# Patient Record
Sex: Male | Born: 1965 | Race: Black or African American | Hispanic: No | Marital: Single | Smoking: Never smoker
Health system: Southern US, Community
[De-identification: ages and names within clinical notes are randomized; demographics above are authoritative.]

## PROBLEM LIST (undated history)

## (undated) DIAGNOSIS — I10 Essential (primary) hypertension: Secondary | ICD-10-CM

## (undated) DIAGNOSIS — E119 Type 2 diabetes mellitus without complications: Secondary | ICD-10-CM

## (undated) DIAGNOSIS — M109 Gout, unspecified: Secondary | ICD-10-CM

## (undated) DIAGNOSIS — E785 Hyperlipidemia, unspecified: Secondary | ICD-10-CM

## (undated) HISTORY — DX: Essential (primary) hypertension: I10

## (undated) HISTORY — DX: Hyperlipidemia, unspecified: E78.5

---

## 1998-05-26 ENCOUNTER — Emergency Department (HOSPITAL_COMMUNITY): Admission: EM | Admit: 1998-05-26 | Discharge: 1998-05-26 | Payer: Self-pay | Admitting: Emergency Medicine

## 1998-05-26 ENCOUNTER — Encounter: Payer: Self-pay | Admitting: Emergency Medicine

## 1999-01-18 ENCOUNTER — Emergency Department (HOSPITAL_COMMUNITY): Admission: EM | Admit: 1999-01-18 | Discharge: 1999-01-18 | Payer: Self-pay

## 1999-09-08 ENCOUNTER — Emergency Department (HOSPITAL_COMMUNITY): Admission: EM | Admit: 1999-09-08 | Discharge: 1999-09-08 | Payer: Self-pay

## 2001-06-08 ENCOUNTER — Encounter: Payer: Self-pay | Admitting: Emergency Medicine

## 2001-06-08 ENCOUNTER — Emergency Department (HOSPITAL_COMMUNITY): Admission: EM | Admit: 2001-06-08 | Discharge: 2001-06-08 | Payer: Self-pay | Admitting: Emergency Medicine

## 2001-11-08 ENCOUNTER — Encounter: Payer: Self-pay | Admitting: *Deleted

## 2001-11-08 ENCOUNTER — Emergency Department (HOSPITAL_COMMUNITY): Admission: EM | Admit: 2001-11-08 | Discharge: 2001-11-08 | Payer: Self-pay | Admitting: *Deleted

## 2005-05-18 ENCOUNTER — Emergency Department (HOSPITAL_COMMUNITY): Admission: EM | Admit: 2005-05-18 | Discharge: 2005-05-18 | Payer: Self-pay | Admitting: Emergency Medicine

## 2005-11-22 ENCOUNTER — Emergency Department (HOSPITAL_COMMUNITY): Admission: EM | Admit: 2005-11-22 | Discharge: 2005-11-22 | Payer: Self-pay | Admitting: Family Medicine

## 2007-04-25 ENCOUNTER — Emergency Department (HOSPITAL_COMMUNITY): Admission: EM | Admit: 2007-04-25 | Discharge: 2007-04-25 | Payer: Self-pay | Admitting: Emergency Medicine

## 2009-05-21 ENCOUNTER — Emergency Department (HOSPITAL_COMMUNITY): Admission: EM | Admit: 2009-05-21 | Discharge: 2009-05-21 | Payer: Self-pay | Admitting: Family Medicine

## 2010-09-30 ENCOUNTER — Ambulatory Visit (INDEPENDENT_AMBULATORY_CARE_PROVIDER_SITE_OTHER): Payer: BC Managed Care – PPO

## 2010-09-30 ENCOUNTER — Inpatient Hospital Stay (INDEPENDENT_AMBULATORY_CARE_PROVIDER_SITE_OTHER)
Admission: RE | Admit: 2010-09-30 | Discharge: 2010-09-30 | Disposition: A | Discharge status: Home / Self Care | Payer: BC Managed Care – PPO | Source: Ambulatory Visit | Attending: Family Medicine | Admitting: Family Medicine

## 2010-09-30 DIAGNOSIS — K219 Gastro-esophageal reflux disease without esophagitis: Secondary | ICD-10-CM

## 2012-02-22 ENCOUNTER — Emergency Department (HOSPITAL_COMMUNITY): Payer: Self-pay

## 2012-02-22 ENCOUNTER — Encounter (HOSPITAL_COMMUNITY): Payer: Self-pay | Admitting: Emergency Medicine

## 2012-02-22 ENCOUNTER — Emergency Department (HOSPITAL_COMMUNITY)
Admission: EM | Admit: 2012-02-22 | Discharge: 2012-02-22 | Disposition: A | Discharge status: Home / Self Care | Payer: Self-pay | Attending: Emergency Medicine | Admitting: Emergency Medicine

## 2012-02-22 DIAGNOSIS — Z79899 Other long term (current) drug therapy: Secondary | ICD-10-CM | POA: Insufficient documentation

## 2012-02-22 DIAGNOSIS — R109 Unspecified abdominal pain: Secondary | ICD-10-CM | POA: Insufficient documentation

## 2012-02-22 DIAGNOSIS — M109 Gout, unspecified: Secondary | ICD-10-CM | POA: Insufficient documentation

## 2012-02-22 HISTORY — DX: Gout, unspecified: M10.9

## 2012-02-22 LAB — CBC WITH DIFFERENTIAL/PLATELET
Basophils Relative: 0 % (ref 0–1)
Eosinophils Absolute: 0.1 10*3/uL (ref 0.0–0.7)
Eosinophils Relative: 2 % (ref 0–5)
Lymphs Abs: 1.5 10*3/uL (ref 0.7–4.0)
MCH: 26.8 pg (ref 26.0–34.0)
MCHC: 32.9 g/dL (ref 30.0–36.0)
MCV: 81.4 fL (ref 78.0–100.0)
Neutrophils Relative %: 72 % (ref 43–77)
Platelets: 189 10*3/uL (ref 150–400)
RBC: 5.11 MIL/uL (ref 4.22–5.81)

## 2012-02-22 LAB — COMPREHENSIVE METABOLIC PANEL
Albumin: 3.7 g/dL (ref 3.5–5.2)
BUN: 14 mg/dL (ref 6–23)
Calcium: 8.9 mg/dL (ref 8.4–10.5)
GFR calc Af Amer: 75 mL/min — ABNORMAL LOW (ref >90)
Glucose, Bld: 139 mg/dL — ABNORMAL HIGH (ref 70–99)
Sodium: 139 mEq/L (ref 135–145)
Total Protein: 7.1 g/dL (ref 6.0–8.3)

## 2012-02-22 LAB — POCT I-STAT TROPONIN I: Troponin i, poc: 0 ng/mL (ref 0.00–0.08)

## 2012-02-22 MED ORDER — PANTOPRAZOLE SODIUM 40 MG PO TBEC
40.0000 mg | DELAYED_RELEASE_TABLET | Freq: Once | ORAL | Status: AC
  Administered 2012-02-22: 40 mg via ORAL
  Filled 2012-02-22: qty 1

## 2012-02-22 NOTE — ED Provider Notes (Signed)
History     CSN: [id number]  Arrival date & time 02/22/12  0018   None     Chief Complaint  Patient presents with  . Chest Pain    (Consider location/radiation/quality/duration/timing/severity/associated sxs/prior treatment) HPI [name] with abdominal pain described as cramping that began yesterday evening. The symptoms were moderate to severe at their worst. He attributes this to eating a hot dog yesterday evening. His abdominal cramping has significantly improved since. It is not worse with palpation. He has had only slight nausea with no vomiting or diarrhea. He has had an occasional dry cough. While in the ED he is developed a sore throat, worse with swallowing, and a mild headache.  Past Medical History  Diagnosis Date  . Gout     History reviewed. No pertinent past surgical history.  No family history on file.  History  Substance Use Topics  . Smoking status: Never Smoker   . Smokeless tobacco: Not on file  . Alcohol Use: No      Review of Systems  All other systems reviewed and are negative.    Allergies  Review of patient's allergies indicates no known allergies.  Home Medications   Current Outpatient Rx  Name  Route  Sig  Dispense  Refill  . FEBUXOSTAT 80 MG PO TABS   Oral   Take 1 tablet by mouth daily.         Marland Kitchen HYDROCODONE-ACETAMINOPHEN 10-500 MG PO TABS   Oral   Take 1 tablet by mouth every 6 (six) hours as needed. Pain         . IBUPROFEN 800 MG PO TABS   Oral   Take 800 mg by mouth every 8 (eight) hours as needed. Pain         . PREDNISONE 20 MG PO TABS   Oral   Take 60 mg by mouth daily. Take for 5 days during a gout flare up           BP 121/82  Pulse 100  Temp 98.5 F (36.9 C) (Oral)  Resp 20  SpO2 98%  Physical Exam General: Well-developed, well-nourished male in no acute distress; appearance consistent with age of record HENT: normocephalic, atraumatic; no pharyngeal erythema, edema or  exudate Eyes: Pupils obscured by cosmetic contact lenses; extraocular muscles intact Neck: supple; no lymphadenopathy Heart: regular rate and rhythm Lungs: clear to auscultation bilaterally Abdomen: soft; nondistended; nontender; no masses or hepatosplenomegaly; bowel sounds present Extremities: No deformity; full range of motion Neurologic: Awake, alert and oriented; motor function intact in all extremities and symmetric; no facial droop Skin: Warm and dry Psychiatric: Normal mood and affect    ED Course  Procedures (including critical care time)     MDM   Nursing notes and vitals signs, including pulse oximetry, reviewed.  Summary of this visit's results, reviewed by myself:  Labs:  Results for orders placed during the hospital encounter of 02/22/12 (from the past 24 hour(s))  CBC WITH DIFFERENTIAL     Status: Normal   Collection Time   02/22/12 12:26 AM      Component Value Range   WBC 8.7  4.0 - 10.5 K/uL   RBC 5.11  4.22 - 5.81 MIL/uL   Hemoglobin 13.7  13.0 - 17.0 g/dL   HCT 78.2  95.6 - 21.3 %   MCV 81.4  78.0 - 100.0 fL   MCH 26.8  26.0 - 34.0 pg   MCHC 32.9  30.0 - 36.0  g/dL   RDW 11.9  14.7 - 82.9 %   Platelets 189  150 - 400 K/uL   Neutrophils Relative 72  43 - 77 %   Neutro Abs 6.3  1.7 - 7.7 K/uL   Lymphocytes Relative 17  12 - 46 %   Lymphs Abs 1.5  0.7 - 4.0 K/uL   Monocytes Relative 9  3 - 12 %   Monocytes Absolute 0.8  0.1 - 1.0 K/uL   Eosinophils Relative 2  0 - 5 %   Eosinophils Absolute 0.1  0.0 - 0.7 K/uL   Basophils Relative 0  0 - 1 %   Basophils Absolute 0.0  0.0 - 0.1 K/uL  COMPREHENSIVE METABOLIC PANEL     Status: Abnormal   Collection Time   02/22/12 12:26 AM      Component Value Range   Sodium 139  135 - 145 mEq/L   Potassium 4.2  3.5 - 5.1 mEq/L   Chloride 102  96 - 112 mEq/L   CO2 26  19 - 32 mEq/L   Glucose, Bld 139 (*) 70 - 99 mg/dL   BUN 14  6 - 23 mg/dL   Creatinine, Ser 5.62  0.50 - 1.35 mg/dL   Calcium 8.9  8.4 - 13.0  mg/dL   Total Protein 7.1  6.0 - 8.3 g/dL   Albumin 3.7  3.5 - 5.2 g/dL   AST 22  0 - 37 U/L   ALT 18  0 - 53 U/L   Alkaline Phosphatase 64  39 - 117 U/L   Total Bilirubin 0.4  0.3 - 1.2 mg/dL   GFR calc non Af Amer 65 (*) >90 mL/min   GFR calc Af Amer 75 (*) >90 mL/min  POCT I-STAT TROPONIN I     Status: Normal   Collection Time   02/22/12 12:39 AM      Component Value Range   Troponin i, poc 0.00  0.00 - 0.08 ng/mL   Comment 3             Imaging Studies: Dg Chest 2 View  02/22/2012  *RADIOLOGY REPORT*  Clinical Data: Mid chest pain.  CHEST - 2 VIEW  Comparison: Chest radiograph performed 09/30/2010  Findings: The lungs are well-aerated.  Minimal bibasilar opacities likely reflect atelectasis.  There is no evidence of pleural effusion or pneumothorax.  The heart is normal in size; the mediastinal contour is within normal limits.  No acute osseous abnormalities are seen.  IMPRESSION: Minimal bibasilar airspace opacity likely reflect atelectasis; lungs otherwise clear.   Original Report Authenticated By: Tonia Ghent, M.D.    EKG Interpretation:  Date & Time: 02/22/2012 12:28 AM  Rate: 103  Rhythm: sinus tachycardia  QRS Axis: normal  Intervals: normal  ST/T Wave abnormalities: normal  Conduction Disutrbances:none  Narrative Interpretation:   Old EKG Reviewed: none available  4:42 AM The patient's symptomatology is most consistent with a viral illness. This would explain his abdominal cramping, sore throat and cough. He was advised to take over-the-counter medications for this.              Hanley Seamen, MD 02/22/12 [phone]

## 2012-02-22 NOTE — ED Notes (Signed)
PT. REPORTS CHEST PAIN /UPPER ABDOMINAL PAIN WITH SOB , OCCASIONAL DRY COUGH ONSET THIS EVENING , DENIES NAUSEA /VOMITTING OR DIAPHORESIS.
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
## 2012-09-27 ENCOUNTER — Emergency Department (HOSPITAL_COMMUNITY): Payer: Self-pay

## 2012-09-27 ENCOUNTER — Emergency Department (HOSPITAL_COMMUNITY)
Admission: EM | Admit: 2012-09-27 | Discharge: 2012-09-27 | Disposition: A | Discharge status: Home / Self Care | Payer: Self-pay | Attending: Emergency Medicine | Admitting: Emergency Medicine

## 2012-09-27 ENCOUNTER — Encounter (HOSPITAL_COMMUNITY): Payer: Self-pay | Admitting: *Deleted

## 2012-09-27 DIAGNOSIS — Z79899 Other long term (current) drug therapy: Secondary | ICD-10-CM | POA: Insufficient documentation

## 2012-09-27 DIAGNOSIS — R109 Unspecified abdominal pain: Secondary | ICD-10-CM | POA: Insufficient documentation

## 2012-09-27 DIAGNOSIS — N509 Disorder of male genital organs, unspecified: Secondary | ICD-10-CM | POA: Insufficient documentation

## 2012-09-27 DIAGNOSIS — IMO0002 Reserved for concepts with insufficient information to code with codable children: Secondary | ICD-10-CM | POA: Insufficient documentation

## 2012-09-27 DIAGNOSIS — R300 Dysuria: Secondary | ICD-10-CM | POA: Insufficient documentation

## 2012-09-27 DIAGNOSIS — M109 Gout, unspecified: Secondary | ICD-10-CM | POA: Insufficient documentation

## 2012-09-27 LAB — URINALYSIS, ROUTINE W REFLEX MICROSCOPIC
Leukocytes, UA: NEGATIVE
Nitrite: NEGATIVE
Protein, ur: NEGATIVE mg/dL
Specific Gravity, Urine: 1.023 (ref 1.005–1.030)
Urobilinogen, UA: 1 mg/dL (ref 0.0–1.0)

## 2012-09-27 MED ORDER — HYDROCODONE-ACETAMINOPHEN 5-325 MG PO TABS: 2.0000 | ORAL_TABLET | ORAL | Status: DC | PRN

## 2012-09-27 MED ORDER — KETOROLAC TROMETHAMINE 60 MG/2ML IM SOLN
60.0000 mg | Freq: Once | INTRAMUSCULAR | Status: AC
  Administered 2012-09-27: 60 mg via INTRAMUSCULAR
  Filled 2012-09-27: qty 2

## 2012-09-27 NOTE — ED Notes (Signed)
Pt states 3 days ago developed R flank pain and R testicle pain, states this morning noticed a little bit of burning w/ urination, states recently took prednisone, denies hx of kidney stones, states when he urinates it relieves the pressure, denies urinary freq.

## 2012-09-27 NOTE — ED Provider Notes (Signed)
CSN: [id number]     Arrival date & time 09/27/12  1134 History     First MD Initiated Contact with Patient 09/27/12 1154     Chief Complaint  Patient presents with  . Flank Pain    right  . Testicle Pain    right   (Consider location/radiation/quality/duration/timing/severity/associated sxs/prior Treatment) HPI Comments: Patient presents with a three day history of right flank and lower quadrant pain that radiates into his right lower abdomen and testicle.  He denies any injury or trauma.  No n/v/d.  He does report some burning with urination.  No fevers or chills.    Patient is a 47 y.o. male presenting with flank pain. The history is provided by the patient.  Flank Pain This is a new problem. Episode onset: 3 days ago. The problem occurs constantly. The problem has been gradually worsening. Nothing aggravates the symptoms. Nothing relieves the symptoms. He has tried nothing for the symptoms. The treatment provided no relief.    Past Medical History  Diagnosis Date  . Gout    History reviewed. No pertinent past surgical history. No family history on file. History  Substance Use Topics  . Smoking status: Never Smoker   . Smokeless tobacco: Never Used  . Alcohol Use: No    Review of Systems  Genitourinary: Positive for flank pain.  All other systems reviewed and are negative.    Allergies  Aspirin  Home Medications   Current Outpatient Rx  Name  Route  Sig  Dispense  Refill  . Febuxostat 80 MG TABS   Oral   Take 1 tablet by mouth daily.         Marland Kitchen HYDROcodone-acetaminophen (LORTAB) 10-500 MG per tablet   Oral   Take 1 tablet by mouth every 6 (six) hours as needed. Pain         . ibuprofen (ADVIL,MOTRIN) 800 MG tablet   Oral   Take 800 mg by mouth every 8 (eight) hours as needed. Pain         . predniSONE (DELTASONE) 20 MG tablet   Oral   Take 60 mg by mouth daily. Take for 5 days during a gout flare up          BP 135/102  Pulse 70  Temp(Src) 99  F (37.2 C) (Oral)  Resp 20  SpO2 98% Physical Exam  Nursing note and vitals reviewed. Constitutional: He is oriented to person, place, and time. He appears well-developed and well-nourished. No distress.  HENT:  Head: Normocephalic and atraumatic.  Mouth/Throat: Oropharynx is clear and moist.  Neck: Normal range of motion. Neck supple.  Cardiovascular: Normal rate, regular rhythm and normal heart sounds.   No murmur heard. Pulmonary/Chest: Effort normal and breath sounds normal. No respiratory distress. He has no wheezes.  Abdominal: Soft. Bowel sounds are normal. He exhibits no distension. There is tenderness.  There is mild ttp in the right lower quadrant with no rebound or guarding.    Genitourinary: Penis normal. No penile tenderness.  The penis and testicles appear grossly normal.  Both testicles are freely mobile without evidence for torsion.  There is no inguinal defect or hernia.    Musculoskeletal: Normal range of motion. He exhibits no edema.  Neurological: He is alert and oriented to person, place, and time.  Skin: Skin is warm and dry. He is not diaphoretic.    ED Course   Procedures (including critical care time)  Labs Reviewed  URINALYSIS, ROUTINE W REFLEX MICROSCOPIC  No results found. No diagnosis found.  MDM  Ct and ua are unremarkable.  I suspect the symptoms are musculoskeletal.  Will discharge to home with pain meds, prn follow up.  Geoffery Lyons, MD 09/27/12 1451

## 2012-10-01 ENCOUNTER — Ambulatory Visit: Payer: Self-pay | Admitting: Internal Medicine

## 2012-10-01 VITALS — BP 120/84 | HR 78 | Temp 98.0°F | Resp 18 | Ht 69.0 in | Wt 192.4 lb

## 2012-10-01 DIAGNOSIS — Z0289 Encounter for other administrative examinations: Secondary | ICD-10-CM

## 2012-10-02 ENCOUNTER — Encounter: Payer: Self-pay | Admitting: Internal Medicine

## 2012-10-02 NOTE — Progress Notes (Signed)
  Subjective:    Patient ID: Ricky Glass, male    DOB: September 29, 1965, 47 y.o.   MRN: [id number]  HPI No problems   Review of Systems neg    Objective:   Physical Exam Normal exam for DOT       Assessment & Plan:  DOT card

## 2012-10-09 ENCOUNTER — Encounter: Payer: Self-pay | Admitting: Internal Medicine

## 2013-05-25 ENCOUNTER — Emergency Department (HOSPITAL_COMMUNITY)
Admission: EM | Admit: 2013-05-25 | Discharge: 2013-05-26 | Disposition: A | Discharge status: Home / Self Care | Payer: BC Managed Care – PPO | Attending: Emergency Medicine | Admitting: Emergency Medicine

## 2013-05-25 DIAGNOSIS — M5380 Other specified dorsopathies, site unspecified: Secondary | ICD-10-CM | POA: Insufficient documentation

## 2013-05-25 DIAGNOSIS — R109 Unspecified abdominal pain: Secondary | ICD-10-CM | POA: Insufficient documentation

## 2013-05-25 DIAGNOSIS — Z79899 Other long term (current) drug therapy: Secondary | ICD-10-CM | POA: Insufficient documentation

## 2013-05-25 DIAGNOSIS — M62830 Muscle spasm of back: Secondary | ICD-10-CM

## 2013-05-25 DIAGNOSIS — IMO0001 Reserved for inherently not codable concepts without codable children: Secondary | ICD-10-CM | POA: Insufficient documentation

## 2013-05-25 DIAGNOSIS — M109 Gout, unspecified: Secondary | ICD-10-CM | POA: Insufficient documentation

## 2013-05-26 ENCOUNTER — Encounter (HOSPITAL_COMMUNITY): Payer: Self-pay | Admitting: Emergency Medicine

## 2013-05-26 MED ORDER — METHOCARBAMOL 100 MG/ML IJ SOLN
1000.0000 mg | Freq: Once | INTRAMUSCULAR | Status: DC
  Filled 2013-05-26: qty 10

## 2013-05-26 MED ORDER — HYDROCODONE-ACETAMINOPHEN 5-325 MG PO TABS: 2.0000 | ORAL_TABLET | ORAL | Status: DC | PRN

## 2013-05-26 MED ORDER — METHOCARBAMOL 500 MG PO TABS
1000.0000 mg | ORAL_TABLET | Freq: Once | ORAL | Status: AC
  Administered 2013-05-26: 1000 mg via ORAL
  Filled 2013-05-26: qty 2

## 2013-05-26 MED ORDER — NAPROXEN 500 MG PO TABS: 500.0000 mg | ORAL_TABLET | Freq: Two times a day (BID) | ORAL | Status: DC

## 2013-05-26 MED ORDER — KETOROLAC TROMETHAMINE 60 MG/2ML IM SOLN
60.0000 mg | Freq: Once | INTRAMUSCULAR | Status: AC
  Administered 2013-05-26: 60 mg via INTRAMUSCULAR
  Filled 2013-05-26: qty 2

## 2013-05-26 MED ORDER — METHOCARBAMOL 500 MG PO TABS: 500.0000 mg | ORAL_TABLET | Freq: Two times a day (BID) | ORAL | Status: DC | PRN

## 2013-05-26 NOTE — Discharge Instructions (Signed)
Back Pain: ° ° °Your back pain should be treated with medicines such as ibuprofen or aleve and this back pain should get better over the next 2 weeks.  However if you develop severe or worsening pain, low back pain with fever, numbness, weakness or inability to walk or urinate, you should return to the ER immediately.  Please follow up with your doctor this week for a recheck if still having symptoms. °Low back pain is discomfort in the lower back that may be due to injuries to muscles and ligaments around the spine.  Occasionally, it may be caused by a a problem to a part of the spine called a disc.  The pain may last several days or a week;  However, most patients get completely well in 4 weeks. ° °Self - care:  The application of heat can help soothe the pain.  Maintaining your daily activities, including walking, is encourged, as it will help you get better faster than just staying in bed. ° °Medications are also useful to help with pain control.  A commonly prescribed medications includes acetaminophen.  This medication is generally safe, though you should not take more than 8 of the extra strength (500mg) pills a day. ° °Non steroidal anti inflammatory medications including Ibuprofen and naproxen;  These medications help both pain and swelling and are very useful in treating back pain.  They should be taken with food, as they can cause stomach upset, and more seriously, stomach bleeding.   ° °Muscle relaxants:  These medications can help with muscle tightness that is a cause of lower back pain.  Most of these medications can cause drowsiness, and it is not safe to drive or use dangerous machinery while taking them. ° °You will need to follow up with  Your primary healthcare provider in 1-2 weeks for reassessment. ° °Be aware that if you develop new symptoms, such as a fever, leg weakness, difficulty with or loss of control of your urine or bowels, abdominal pain, or more severe pain, you will need to seek  medical attention and  / or return to the Emergency department. ° °If you do not have a doctor see the list below. ° °RESOURCE GUIDE ° °Chronic Pain Problems: °Contact [facility]  [phone] °Patients need to be referred by their primary care doctor. ° °Insufficient Money for Medicine: °Contact [employer]:  call "211" or [facility] 271-5999. ° °No Primary Care Doctor: °- Call [facility]  832-8000 - can help you locate a primary care doctor that  accepts your insurance, provides certain services, etc. °- Physician Referral Service- 1-800-533-3463 ° °Agencies that provide inexpensive medical care: °- [facility]  832-8035 °-  Internal Medicine  832-7272 °- [facility]  [phone] °- Women[facility] Clinic  832-4777 °- [facility]  [phone] °- [facility]  [phone] ° °Medicaid-accepting [place] Providers: °- [facility]- [address] ° [phone], Mon-Fri 9am-7pm, Sat 9am-1pm °- [facility]- [address], [address] ° [phone] °- [facility]- [address], [address] ° [phone] °- [facility]- [address] [place] [address] ° [phone] °- [facility]- [address], [address], [phone] ° Only accepts [place] Access Medicaid patients after they have their name  applied to their card ° °Self Pay (no insurance) in [place]: °- Sickle Cell Patients: Dr Eric Dean, [facility] °   [address], [phone] °- [facility]- [address] ° [phone] °      -     [facility]- [address] °      -     [facility]- see information above (Speak to Pam H if you do not have insurance) °      -  [facility]- [address], [phone] °      -  [facility] [place]- [address],  [phone] °      -  [facility]- [address] [place] [address],  [phone] °      -  Dr Osei-Bonsu-  [address], [place], [phone] °      -  [facility]- [address], [phone] °      -  [facility]- [address] [place] [address], [phone], also [address], [phone] °      -    [facility]- [address], [phone], 1st & 3rd Saturday   every month, 10am-1pm ° °1) Find a Doctor and Pay Out of Pocket °Although you won't have to find out who is covered by your insurance plan, it is a good idea to ask around and get recommendations. You will then need to call the office and see if the doctor you have chosen will accept you as a new patient and what types of options they offer for patients who are self-pay. Some doctors offer discounts or will set up payment plans for their patients who do not have insurance, but you will need to ask so you aren't surprised when you get to your appointment. ° °2) Contact Your Local Health Department °Not all health departments have doctors that can see patients for sick visits, but many do, so it is worth a call to see if yours does. If you don't know where your local health department is, you can check in your phone book. The CDC also has a tool to help you locate your state's health department, and many state websites also have listings of all of their local health departments. ° °3) Find a Walk-in Clinic °If your illness is not likely to be very severe or complicated, you may want to try a walk in clinic. These are popping up all over the country in pharmacies, drugstores, and shopping centers. They're usually staffed by nurse practitioners or physician assistants that have been trained to treat common illnesses and complaints. They're usually fairly quick and inexpensive. However, if you have serious medical issues or chronic medical problems, these are probably not your best option ° °STD Testing °- [place] [place], [facility], [address], [place], phone [phone] or 1-877-539-9860.  Monday - Friday, call for an appointment. °- [place] [place], [facility], [address], [place], phone [phone] or 1-877-539-9860.  Monday - Friday, call for an appointment. ° °Abuse/Neglect: °- [place] Child Abuse Hotline (336) 641-3795 °- [place] Child Abuse Hotline 800-378-5315 (After Hours) ° °Emergency Shelter:  [facility] (336) 271-5985 ° °Maternity Homes: °- [facility] (336) 275-9566 °- [facility] (704) 372-4663 ° °MRSA Hotline #:   832-7006 ° °[place] Resources ° °[facility] [place]  [employer] [place] [facility] °[address]                 [address]         [address]  °[place]                                               [place]                              [place] °Phone:  349-3220                                  Phone:  342-7768                   Phone:  342-8140 ° °[facility], 342-8316 °- [place] [facility] - [facility]- 1-888-581-9988 °      -     [facility] in [place], [address],                                  336-349-4454, Insurance ° °[place] Child Abuse Hotline °(336) 342-1394 or (336) 342-3537 (After Hours) ° ° °Behavioral Health Services ° °Substance Abuse Resources: °- [facility]  336-882-2125 °- [facility] 336-784-9470 °- [facility] 336-285-9073 °- [facility] 336-845-3988 °- Residential & Outpatient Substance Abuse Program  800-659-3381 ° °Psychological Services: °- [facility]  832-9600 °- [facility]  378-7881 °- [facility], [address], [place], ACCESS LINE: 1-800-853-5163 or 336-641-4981, Http://www.[facility]/services/adult.htm ° °Dental Assistance ° °If unable to pay or  uninsured, contact:  [facility] or [facility] to become qualified for the adult dental clinic. ° °Patients with Medicaid: [facility] °[address], [phone] °[address], [phone] ° °If unable to pay, or uninsured, contact [facility] (271-5999) or [place] (641-3152 in [place], 842-7733 in [place]) to become qualified for the adult dental clinic ° °Other Low-Cost Community Dental Services: °- [facility]- [address], [place], [place], [address], 723-1848, Ext. [address], 2nd and 4th Thursday of the month at 6:30am.  10 clients each day by appointment, can sometimes see walk-in patients if someone does not show for an appointment. °- [facility]- [address], [place], [place], [address], 723-7904 °- [facility]- [address], [place], [place], [address], [phone] °- [facility]- 342-8273 °- [facility]- 703-3100 °- [facility]- 570-6415 ° ° ° ° ° ° °
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
## 2013-09-15 ENCOUNTER — Telehealth: Payer: Self-pay
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
## 2013-09-15 NOTE — Telephone Encounter (Signed)
Spoke to [place]- the DOT card is in Dr. Perrin MalteseGuest box at the bottom of the stairs. I will follow up on this tomorrow when Dr. Perrin MalteseGuest is in the office.

## 2013-09-16 NOTE — Telephone Encounter (Signed)
Called patient and notified DOT card ready for pick up

## 2014-06-27 ENCOUNTER — Ambulatory Visit (INDEPENDENT_AMBULATORY_CARE_PROVIDER_SITE_OTHER): Payer: 59 | Admitting: Emergency Medicine

## 2014-06-27 ENCOUNTER — Ambulatory Visit (INDEPENDENT_AMBULATORY_CARE_PROVIDER_SITE_OTHER): Payer: 59

## 2014-06-27 VITALS — BP 128/88 | HR 92 | Temp 98.0°F | Resp 16 | Ht 69.5 in | Wt 211.2 lb

## 2014-06-27 DIAGNOSIS — R1013 Epigastric pain: Secondary | ICD-10-CM | POA: Diagnosis not present

## 2014-06-27 DIAGNOSIS — M25521 Pain in right elbow: Secondary | ICD-10-CM

## 2014-06-27 DIAGNOSIS — M109 Gout, unspecified: Secondary | ICD-10-CM

## 2014-06-27 DIAGNOSIS — M10021 Idiopathic gout, right elbow: Secondary | ICD-10-CM

## 2014-06-27 LAB — POCT URINALYSIS DIPSTICK
Bilirubin, UA: NEGATIVE
Glucose, UA: NEGATIVE
Ketones, UA: NEGATIVE
LEUKOCYTES UA: NEGATIVE
Nitrite, UA: NEGATIVE
PROTEIN UA: NEGATIVE
RBC UA: NEGATIVE
Spec Grav, UA: 1.02
UROBILINOGEN UA: 1
pH, UA: 5.5

## 2014-06-27 LAB — POCT CBC
GRANULOCYTE PERCENT: 72.2 % (ref 37–80)
HEMATOCRIT: 42.7 % — AB (ref 43.5–53.7)
HEMOGLOBIN: 13.3 g/dL — AB (ref 14.1–18.1)
Lymph, poc: 2.1 (ref 0.6–3.4)
MCH, POC: 25.8 pg — AB (ref 27–31.2)
MCHC: 31.2 g/dL — AB (ref 31.8–35.4)
MCV: 82.6 fL (ref 80–97)
MID (cbc): 0.6 (ref 0–0.9)
MPV: 7.7 fL (ref 0–99.8)
POC GRANULOCYTE: 7.2 — AB (ref 2–6.9)
POC LYMPH PERCENT: 21.3 %L (ref 10–50)
POC MID %: 6.5 % (ref 0–12)
Platelet Count, POC: 217 10*3/uL (ref 142–424)
RBC: 5.17 M/uL (ref 4.69–6.13)
RDW, POC: 14.2 %
WBC: 10 10*3/uL (ref 4.6–10.2)

## 2014-06-27 LAB — POCT UA - MICROSCOPIC ONLY
BACTERIA, U MICROSCOPIC: NEGATIVE
CASTS, UR, LPF, POC: NEGATIVE
CRYSTALS, UR, HPF, POC: NEGATIVE
MUCUS UA: NEGATIVE
RBC, URINE, MICROSCOPIC: NEGATIVE
Yeast, UA: NEGATIVE

## 2014-06-27 LAB — POCT SEDIMENTATION RATE: POCT SED RATE: 56 mm/hr — AB (ref 0–22)

## 2014-06-27 MED ORDER — INDOMETHACIN 50 MG PO CAPS: 50.0000 mg | ORAL_CAPSULE | Freq: Three times a day (TID) | ORAL | Status: DC

## 2014-06-27 MED ORDER — COLCHICINE 0.6 MG PO TABS: ORAL_TABLET | ORAL | Status: DC

## 2014-06-27 NOTE — Patient Instructions (Signed)

## 2014-06-27 NOTE — Progress Notes (Signed)
[facility] [address], [place] [address] [phone]  Date:  06/27/2014   Name:  Ricky Glass   DOB:  Apr 23, 1965   MRN:  [id number]  PCP:  Pcp Not In System    Chief Complaint: Arm Pain; Arm Swelling; Headache; and Abdominal Pain   History of Present Illness:  Montreal R Glass is a 49 y.o. very pleasant male patient who presents with the following:  Has no history of injury to right elbow.  Says awoke yesterday with pain and immobility of elbow and swelling.   Tender over olecranon bursa Has gout and takes allopurinol.  Has prescription for motrin and vicodin Pain in elbow associated with swelling. No injury or overuse. Has noted crampy abdominal pain following trip out of town Has dark urine associated with myalgias and arthralgias and nausea Poor appetite. No improvement with over the counter medications or other home remedies.  Denies other complaint or health concern today.  There are no active problems to display for this patient.   Past Medical History  Diagnosis Date  . Gout     History reviewed. No pertinent past surgical history.  History  Substance Use Topics  . Smoking status: Never Smoker   . Smokeless tobacco: Never Used  . Alcohol Use: No    History reviewed. No pertinent family history.  Allergies  Allergen Reactions  . Aspirin     Childhood reaction    Medication list has been reviewed and updated.  Current Outpatient Prescriptions on File Prior to Visit  Medication Sig Dispense Refill  . allopurinol (ZYLOPRIM) 100 MG tablet Take 100 mg by mouth daily.    Marland Kitchen. HYDROcodone-acetaminophen (NORCO) 10-325 MG per tablet Take 1 tablet by mouth every 6 (six) hours as needed for moderate pain.    Marland Kitchen. HYDROcodone-acetaminophen (NORCO/VICODIN) 5-325 MG per tablet Take 2 tablets by mouth every 4 (four) hours as needed. 10 tablet 0  . naproxen (NAPROSYN) 500 MG tablet Take 1 tablet (500 mg total) by mouth 2 (two) times daily with  a meal. 30 tablet 0  . methocarbamol (ROBAXIN) 500 MG tablet Take 1 tablet (500 mg total) by mouth 2 (two) times daily as needed for muscle spasms. (Patient not taking: Reported on 06/27/2014) 20 tablet 0   No current facility-administered medications on file prior to visit.    Review of Systems:  Review of Systems  Constitutional: Negative for fever, chills and fatigue.  HENT: Negative for congestion, ear pain, hearing loss, postnasal drip, rhinorrhea and sinus pressure.   Eyes: Negative for discharge and redness.  Respiratory: Negative for cough, shortness of breath and wheezing.   Cardiovascular: Negative for chest pain and leg swelling.  Gastrointestinal: Negative for nausea, vomiting, abdominal pain, constipation and blood in stool.  Genitourinary: Negative for dysuria, urgency and frequency.  Musculoskeletal: Negative for neck stiffness.  Skin: Negative for rash.  Neurological: Negative for seizures, weakness and headaches.     Physical Examination: Filed Vitals:   06/27/14 1719  BP: 128/88  Pulse: 92  Temp: 98 F (36.7 C)  Resp: 16   Filed Vitals:   06/27/14 1719  Height: 5' 9.5" (1.765 m)  Weight: 211 lb 3.2 oz (95.8 kg)   Body mass index is 30.75 kg/(m^2). Ideal Body Weight: Weight in (lb) to have BMI = 25: 171.4  GEN: WDWN, NAD, Non-toxic, A & O x 3 HEENT: Atraumatic, Normocephalic. Neck supple. No masses, No LAD. Ears and Nose: No external deformity. CV: RRR,  No M/G/R. No JVD. No thrill. No extra heart sounds. PULM: CTA B, no wheezes, crackles, rhonchi. No retractions. No resp. distress. No accessory muscle use. ABD: S, NT, ND, +BS. No rebound. No HSM. EXTR: No c/c/e  Right elbow swelling with limited ROM and tenderness medial epicondyle NEURO Normal gait.  PSYCH: Normally interactive. Conversant. Not depressed or anxious appearing.  Calm demeanor.    Assessment and Plan: Gout  Possible hepatitis Labs pending Indocin  Colchicine   Signed Phillips Odor, MD   [facility] reading (PRIMARY) by  Dr. Lemar Lofty arthritis??  No acute injury.  Results for orders placed or performed in visit on 06/27/14  POCT CBC  Result Value Ref Range   WBC 10.0 4.6 - 10.2 K/uL   Lymph, poc 2.1 0.6 - 3.4   POC LYMPH PERCENT 21.3 10 - 50 %L   MID (cbc) 0.6 0 - 0.9   POC MID % 6.5 0 - 12 %M   POC Granulocyte 7.2 (A) 2 - 6.9   Granulocyte percent 72.2 37 - 80 %G   RBC 5.17 4.69 - 6.13 M/uL   Hemoglobin 13.3 (A) 14.1 - 18.1 g/dL   HCT, POC 16.1 (A) 09.6 - 53.7 %   MCV 82.6 80 - 97 fL   MCH, POC 25.8 (A) 27 - 31.2 pg   MCHC 31.2 (A) 31.8 - 35.4 g/dL   RDW, POC 04.5 %   Platelet Count, POC 217 142 - 424 K/uL   MPV 7.7 0 - 99.8 fL  POCT urinalysis dipstick  Result Value Ref Range   Color, UA yellow    Clarity, UA clear    Glucose, UA neg    Bilirubin, UA neg    Ketones, UA neg    Spec Grav, UA 1.020    Blood, UA neg    pH, UA 5.5    Protein, UA neg    Urobilinogen, UA 1.0    Nitrite, UA neg    Leukocytes, UA Negative   POCT UA - Microscopic Only  Result Value Ref Range   WBC, Ur, HPF, POC 0-3    RBC, urine, microscopic neg    Bacteria, U Microscopic neg    Mucus, UA neg    Epithelial cells, urine per micros 0-1    Crystals, Ur, HPF, POC neg    Casts, Ur, LPF, POC neg    Yeast, UA neg

## 2014-06-28 LAB — URIC ACID: Uric Acid, Serum: 8.9 mg/dL — ABNORMAL HIGH (ref 4.0–7.8)

## 2014-06-28 LAB — HEPATITIS PANEL, ACUTE
HCV Ab: NEGATIVE
HEP A IGM: NONREACTIVE
HEP B S AG: NEGATIVE
Hep B C IgM: NONREACTIVE

## 2014-06-28 LAB — HEPATITIS C ANTIBODY: HCV Ab: NEGATIVE

## 2014-09-04 ENCOUNTER — Ambulatory Visit (INDEPENDENT_AMBULATORY_CARE_PROVIDER_SITE_OTHER): Payer: 59 | Admitting: Family Medicine

## 2014-09-04 VITALS — BP 114/82 | HR 103 | Temp 97.9°F | Resp 14 | Ht 69.5 in | Wt 209.4 lb

## 2014-09-04 DIAGNOSIS — M1000 Idiopathic gout, unspecified site: Secondary | ICD-10-CM | POA: Diagnosis not present

## 2014-09-04 MED ORDER — HYDROCODONE-ACETAMINOPHEN 5-325 MG PO TABS: 2.0000 | ORAL_TABLET | ORAL | Status: DC | PRN

## 2014-09-04 MED ORDER — PREDNISONE 10 MG PO TABS: 20.0000 mg | ORAL_TABLET | Freq: Two times a day (BID) | ORAL | Status: DC

## 2014-09-04 NOTE — Progress Notes (Signed)
° °  Subjective:    Patient ID: Ricky Glass, male    DOB: October 15, 1965, 49 y.o.   MRN: [id number] This chart was scribed for Ricky SidleKurt Lauenstein, MD by Littie Deedsichard Sun, Medical Scribe. This patient was seen in Room 14 and the patient's care was started at 2:00 PM.   HPI HPI Comments: Ricky Glass is a 49 y.o. male with a history of gout who presents [facility] complaining of throbbing left foot pain in the great toe which he states is due to a flare-up of gout. He had tried to get his gout medications filled with his PCP; however, he was unable to see his PCP who moved to [place]. He has FMHx of gout in multiple family members.  Patient has tried allopurinol the past but only makes him worse.  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  Review of Systems Patient has had gout in his left elbow as well which resulted in a significant swelling which is only now healing.    Objective:   Physical Exam CONSTITUTIONAL: Well developed/well nourished HEAD: Normocephalic/atraumatic EYES: EOM/PERRL ENMT: Mucous membranes moist NECK: supple no meningeal signs SPINE: entire spine nontender CV: S1/S2 noted, no murmurs/rubs/gallops noted LUNGS: Lungs are clear to auscultation bilaterally, no apparent distress ABDOMEN: soft, nontender, no rebound or guarding GU: no cva tenderness NEURO: Pt is awake/alert, moves all extremitiesx4 EXTREMITIES: pulses normal, full ROM but tender left great MTP joint with mild swelling in that area of the foot. SKIN: warm, color normal PSYCH: no abnormalities of mood noted        Assessment & Plan:   This chart was scribed in my presence and reviewed by me personally.    ICD-9-CM ICD-10-CM   1. Acute idiopathic gout, unspecified site 274.01 M10.00 HYDROcodone-acetaminophen (NORCO/VICODIN) 5-325 MG per tablet     predniSONE (DELTASONE) 10 MG tablet     Signed, Ricky SidleKurt Lauenstein, MD

## 2014-09-04 NOTE — Patient Instructions (Signed)

## 2014-09-29 ENCOUNTER — Ambulatory Visit (INDEPENDENT_AMBULATORY_CARE_PROVIDER_SITE_OTHER): Payer: 59 | Admitting: Emergency Medicine

## 2014-09-29 VITALS — BP 126/88 | HR 97 | Temp 98.5°F | Resp 12 | Ht 69.5 in | Wt 206.1 lb

## 2014-09-29 DIAGNOSIS — Z021 Encounter for pre-employment examination: Secondary | ICD-10-CM

## 2014-09-29 DIAGNOSIS — Z024 Encounter for examination for driving license: Secondary | ICD-10-CM

## 2014-09-29 NOTE — Progress Notes (Signed)
Subjective:  Patient ID: Ricky Glass, male    DOB: 1965/10/08  Age: 49 y.o. MRN: [id number]  CC: Annual Exam   HPI Ricky Glass presents  for a DOT examination. He has no chronic medical problems other than gout  History Ricky Glass has a past medical history of Gout.   He has no past surgical history on file.   His  family history is not on file.  He   reports that he has never smoked. He has never used smokeless tobacco. He reports that he does not drink alcohol or use illicit drugs.  Outpatient Prescriptions Prior to Visit  Medication Sig Dispense Refill  . allopurinol (ZYLOPRIM) 100 MG tablet Take 100 mg by mouth daily.    Marland Kitchen HYDROcodone-acetaminophen (NORCO/VICODIN) 5-325 MG per tablet Take 2 tablets by mouth every 4 (four) hours as needed. 20 tablet 0  . predniSONE (DELTASONE) 10 MG tablet Take 2 tablets (20 mg total) by mouth 2 (two) times daily with a meal. 20 tablet 1  . colchicine 0.6 MG tablet 2 now and one in one hour.  Tomorrow 1 po bid (Patient not taking: Reported on 09/04/2014) 30 tablet 1  . indomethacin (INDOCIN) 50 MG capsule Take 1 capsule (50 mg total) by mouth 3 (three) times daily with meals. (Patient not taking: Reported on 09/04/2014) 45 capsule 1  . methocarbamol (ROBAXIN) 500 MG tablet Take 1 tablet (500 mg total) by mouth 2 (two) times daily as needed for muscle spasms. (Patient not taking: Reported on 06/27/2014) 20 tablet 0  . naproxen (NAPROSYN) 500 MG tablet Take 1 tablet (500 mg total) by mouth 2 (two) times daily with a meal. (Patient not taking: Reported on 09/04/2014) 30 tablet 0   No facility-administered medications prior to visit.    History   Social History  . Marital Status: Single    Spouse Name: N/A  . Number of Children: N/A  . Years of Education: N/A   Social History Main Topics  . Smoking status: Never Smoker   . Smokeless tobacco: Never Used  . Alcohol Use: No  . Drug Use: No  . Sexual Activity: Not on file   Other Topics  Concern  . None   Social History Narrative     Review of Systems  Constitutional: Negative for fever, chills and appetite change.  HENT: Negative for congestion, ear pain, postnasal drip, sinus pressure and sore throat.   Eyes: Negative for pain and redness.  Respiratory: Negative for cough, shortness of breath and wheezing.   Cardiovascular: Negative for leg swelling.  Gastrointestinal: Negative for nausea, vomiting, abdominal pain, diarrhea, constipation and blood in stool.  Endocrine: Negative for polyuria.  Genitourinary: Negative for dysuria, urgency, frequency and flank pain.  Musculoskeletal: Negative for gait problem.  Skin: Negative for rash.  Neurological: Negative for weakness and headaches.  Psychiatric/Behavioral: Negative for confusion and decreased concentration. The patient is not nervous/anxious.     Objective:  BP 126/88 mmHg  Pulse 97  Temp(Src) 98.5 F (36.9 C) (Oral)  Resp 12  Ht 5' 9.5" (1.765 m)  Wt 206 lb 2 oz (93.498 kg)  BMI 30.01 kg/m2  SpO2 98%  Physical Exam  Constitutional: He is oriented to person, place, and time. He appears well-developed and well-nourished. No distress.  HENT:  Head: Normocephalic and atraumatic.  Right Ear: External ear normal.  Left Ear: External ear normal.  Nose: Nose normal.  Eyes: Conjunctivae and EOM are normal. Pupils are equal, round, and reactive  to light. No scleral icterus.  Neck: Normal range of motion. Neck supple. No tracheal deviation present.  Cardiovascular: Normal rate, regular rhythm and normal heart sounds.   Pulmonary/Chest: Effort normal. No respiratory distress. He has no wheezes. He has no rales.  Abdominal: He exhibits no mass. There is no tenderness. There is no rebound and no guarding.  Musculoskeletal: He exhibits no edema.  Lymphadenopathy:    He has no cervical adenopathy.  Neurological: He is alert and oriented to person, place, and time. Coordination normal.  Skin: Skin is warm and  dry. No rash noted.  Psychiatric: He has a normal mood and affect. His behavior is normal.      Assessment & Plan:   Ricky Glass was seen today for annual exam.  Diagnoses and all orders for this visit:  Encounter for commercial driver medical examination (CDME)   I am having Ricky Glass maintain his allopurinol, naproxen, methocarbamol, indomethacin, colchicine, HYDROcodone-acetaminophen, and predniSONE.  No orders of the defined types were placed in this encounter.    Appropriate red flag conditions were discussed with the patient as well as actions that should be taken.  Patient expressed his understanding.  Follow-up: Return if symptoms worsen or fail to improve.  Carmelina Dane, MD

## 2014-09-29 NOTE — Patient Instructions (Signed)
Ricky Glass is an inflammatory arthritis caused by a buildup of uric acid crystals in the joints. Uric acid is a chemical that is normally present in the blood. When the level of uric acid in the blood is too high it can form crystals that deposit in your joints and tissues. This causes joint redness, soreness, and swelling (inflammation). Repeat attacks are common. Over time, uric acid crystals can form into masses (tophi) near a joint, destroying bone and causing disfigurement. Glass is treatable and often preventable. CAUSES  The disease begins with elevated levels of uric acid in the blood. Uric acid is produced by your body when it breaks down a naturally found substance called purines. Certain foods you eat, such as meats and fish, contain high amounts of purines. Causes of an elevated uric acid level include:  Being passed down from parent to child (heredity).  Diseases that cause increased uric acid production (such as obesity, psoriasis, and certain cancers).  Excessive alcohol use.  Diet, especially diets rich in meat and seafood.  Medicines, including certain cancer-fighting medicines (chemotherapy), water pills (diuretics), and aspirin.  Chronic kidney disease. The kidneys are no longer able to remove uric acid well.  Problems with metabolism. Conditions strongly associated with Glass include:  Obesity.  High blood pressure.  High cholesterol.  Diabetes. Not everyone with elevated uric acid levels gets Glass. It is not understood why some people get Glass and others do not. Surgery, joint injury, and eating too much of certain foods are some of the factors that can lead to Glass attacks. SYMPTOMS   An attack of Glass comes on quickly. It causes intense pain with redness, swelling, and warmth in a joint.  Fever can occur.  Often, only one joint is involved. Certain joints are more commonly involved:  Base of the big toe.  Knee.  Ankle.  Wrist.  Finger. Without  treatment, an attack usually goes away in a few days to weeks. Between attacks, you usually will not have symptoms, which is different from many other forms of arthritis. DIAGNOSIS  Your caregiver will suspect Glass based on your symptoms and exam. In some cases, tests may be recommended. The tests may include:  Blood tests.  Urine tests.  X-rays.  Joint fluid exam. This exam requires a needle to remove fluid from the joint (arthrocentesis). Using a microscope, Glass is confirmed when uric acid crystals are seen in the joint fluid. TREATMENT  There are two phases to Glass treatment: treating the sudden onset (acute) attack and preventing attacks (prophylaxis).  Treatment of an Acute Attack.  Medicines are used. These include anti-inflammatory medicines or steroid medicines.  An injection of steroid medicine into the affected joint is sometimes necessary.  The painful joint is rested. Movement can worsen the arthritis.  You may use warm or cold treatments on painful joints, depending which works best for you.  Treatment to Prevent Attacks.  If you suffer from frequent Glass attacks, your caregiver may advise preventive medicine. These medicines are started after the acute attack subsides. These medicines either help your kidneys eliminate uric acid from your body or decrease your uric acid production. You may need to stay on these medicines for a very long time.  The early phase of treatment with preventive medicine can be associated with an increase in acute Glass attacks. For this reason, during the first few months of treatment, your caregiver may also advise you to take medicines usually used for acute Glass treatment. Be sure you  understand your caregiver's directions. Your caregiver may make several adjustments to your medicine dose before these medicines are effective.  Discuss dietary treatment with your caregiver or dietitian. Alcohol and drinks high in sugar and fructose and foods  such as meat, poultry, and seafood can increase uric acid levels. Your caregiver or dietitian can advise you on drinks and foods that should be limited. HOME CARE INSTRUCTIONS   Do not take aspirin to relieve pain. This raises uric acid levels.  Only take over-the-counter or prescription medicines for pain, discomfort, or fever as directed by your caregiver.  Rest the joint as much as possible. When in bed, keep sheets and blankets off painful areas.  Keep the affected joint raised (elevated).  Apply warm or cold treatments to painful joints. Use of warm or cold treatments depends on which works best for you.  Use crutches if the painful joint is in your leg.  Drink enough fluids to keep your urine clear or pale yellow. This helps your body get rid of uric acid. Limit alcohol, sugary drinks, and fructose drinks.  Follow your dietary instructions. Pay careful attention to the amount of protein you eat. Your daily diet should emphasize fruits, vegetables, whole grains, and fat-free or low-fat milk products. Discuss the use of coffee, vitamin C, and cherries with your caregiver or dietitian. These may be helpful in lowering uric acid levels.  Maintain a healthy body weight. SEEK MEDICAL CARE IF:   You develop diarrhea, vomiting, or any side effects from medicines.  You do not feel better in 24 hours, or you are getting worse. SEEK IMMEDIATE MEDICAL CARE IF:   Your joint becomes suddenly more tender, and you have chills or a fever. MAKE SURE YOU:   Understand these instructions.  Will watch your condition.  Will get help right away if you are not doing well or get worse. Document Released: 02/09/2000 Document Revised: 06/28/2013 Document Reviewed: 09/25/2011 Exit[employer] Patient Information 2015 [employer]. This information is not intended to replace advice given to you by your health care provider. Make sure you discuss any questions you have with your health care provider.  

## 2014-12-28 ENCOUNTER — Ambulatory Visit (INDEPENDENT_AMBULATORY_CARE_PROVIDER_SITE_OTHER): Payer: 59 | Admitting: Family Medicine

## 2014-12-28 ENCOUNTER — Ambulatory Visit (INDEPENDENT_AMBULATORY_CARE_PROVIDER_SITE_OTHER): Payer: 59

## 2014-12-28 VITALS — BP 130/90 | HR 93 | Temp 98.2°F | Resp 16 | Ht 69.0 in | Wt 208.0 lb

## 2014-12-28 DIAGNOSIS — M542 Cervicalgia: Secondary | ICD-10-CM

## 2014-12-28 DIAGNOSIS — M62838 Other muscle spasm: Secondary | ICD-10-CM | POA: Diagnosis not present

## 2014-12-28 DIAGNOSIS — R1310 Dysphagia, unspecified: Secondary | ICD-10-CM

## 2014-12-28 MED ORDER — METHOCARBAMOL 500 MG PO TABS: 500.0000 mg | ORAL_TABLET | Freq: Four times a day (QID) | ORAL | Status: DC | PRN

## 2014-12-28 NOTE — Progress Notes (Addendum)
Subjective:    Patient ID: Ricky Glass, male    DOB: 1965/03/25, 49 y.o.   MRN: [id number] This chart was scribed for Ricky Staggers, MD by Jolene Provost, Medical Scribe. This patient was seen in Room 11 and the patient's care was started a 8:55 PM.  Chief Complaint  Patient presents with  . Dysphagia    x 1 day    HPI HPI Comments: Ricky Glass is a 49 y.o. male who presents to [facility] complaining of difficulty swallowing since this morning. He states he woke up and couldn't turn his neck to the left due to pain. It worsened throughout the day, and tried to eat lunch and could not swallow food due to pain. Then he tried to drink water and could not swallow water either due to pain. he is able to pass food, but it is painful. He also states he has been having bad HA for three days. He denies phonophobia or photophobia. He has not taken any medications for pain. He states he had one event of being SOB earlier today that lasted several minutes. This happened after swallowing water. He put a heating pad on his neck this morning without relief. He states he has a past hx of muscle spasms, and has had a back spasm so severe that he called EMS because he was unable to move.  There are no active problems to display for this patient.  Past Medical History  Diagnosis Date  . Gout    History reviewed. No pertinent past surgical history. Allergies  Allergen Reactions  . Aspirin     Childhood reaction   Prior to Admission medications   Medication Sig Start Date End Date Taking? Authorizing Provider  allopurinol (ZYLOPRIM) 100 MG tablet Take 100 mg by mouth daily.   Yes Historical Provider, MD  indomethacin (INDOCIN) 50 MG capsule Take 1 capsule (50 mg total) by mouth 3 (three) times daily with meals. 06/27/14  Yes Carmelina Dane, MD  predniSONE (DELTASONE) 10 MG tablet Take 2 tablets (20 mg total) by mouth 2 (two) times daily with a meal. 09/04/14  Yes Elvina Sidle, MD  colchicine 0.6 MG  tablet 2 now and one in one hour.  Tomorrow 1 po bid Patient not taking: Reported on 09/04/2014 06/27/14   Carmelina Dane, MD  HYDROcodone-acetaminophen (NORCO/VICODIN) 5-325 MG per tablet Take 2 tablets by mouth every 4 (four) hours as needed. Patient not taking: Reported on 12/28/2014 09/04/14   Elvina Sidle, MD  methocarbamol (ROBAXIN) 500 MG tablet Take 1 tablet (500 mg total) by mouth 2 (two) times daily as needed for muscle spasms. Patient not taking: Reported on 06/27/2014 05/26/13   Eber Hong, MD  naproxen (NAPROSYN) 500 MG tablet Take 1 tablet (500 mg total) by mouth 2 (two) times daily with a meal. Patient not taking: Reported on 09/04/2014 05/26/13   Eber Hong, MD   Social History   Social History  . Marital Status: Single    Spouse Name: N/A  . Number of Children: N/A  . Years of Education: N/A   Occupational History  . Not on file.   Social History Main Topics  . Smoking status: Never Smoker   . Smokeless tobacco: Never Used  . Alcohol Use: No  . Drug Use: No  . Sexual Activity: Not on file   Other Topics Concern  . Not on file   Social History Narrative    Review of Systems  Constitutional: Negative for fever  and chills.  HENT: Positive for trouble swallowing. Negative for mouth sores and sore throat.   Neurological: Negative for weakness and numbness.      Objective:   Physical Exam  Constitutional: He is oriented to person, place, and time. He appears well-developed and well-nourished. No distress.  HENT:  Head: Normocephalic and atraumatic.  Pendulous uvula touching back of tongue. No apparent exudates or hypertrophy oropharynx feels normal.   Eyes: Pupils are equal, round, and reactive to light.  Neck: Neck supple.  No stridor. Tender with spasm on the left trapezius, paraspinal muscles. Pain along the SCM. Able to open, close and move jaw laterally without difficulty. Able to rotate neck left, but not right. Full flexion of neck.   Cardiovascular:  Normal rate and normal heart sounds.   Pulmonary/Chest: Effort normal and breath sounds normal. No stridor. No respiratory distress. He has no wheezes.  Breath sounds in all lung fields.   Musculoskeletal: Normal range of motion.  Lymphadenopathy:    He has no cervical adenopathy.  Neurological: He is alert and oriented to person, place, and time. Coordination normal.  Skin: Skin is warm and dry. He is not diaphoretic.  Psychiatric: He has a normal mood and affect. His behavior is normal.  Nursing note and vitals reviewed.   Filed Vitals:   12/28/14 2023  BP: 130/90  Pulse: 93  Temp: 98.2 F (36.8 C)  TempSrc: Oral  Resp: 16  Height:  (1.753 m)  Weight: 208 lb (94.348 kg)  SpO2: 98%   [facility] reading (PRIMARY) by  Dr. Neva Seat: CSpine: Degenerative changes most notable between C3-C4,  no acute findings..     Assessment & Plan:   Ricky Glass is a 49 y.o. male Neck pain on left side - Plan: DG Cervical Spine 2 or 3 views, methocarbamol (ROBAXIN) 500 MG tablet  Pain with swallowing - Plan: DG Cervical Spine 2 or 3 views, methocarbamol (ROBAXIN) 500 MG tablet  Muscle spasm - Plan: methocarbamol (ROBAXIN) 500 MG tablet  Suspected wry neck/spasm of SCM and paraspinals. Pain with swallowing on clarification of hx appears to be localized to outside of neck and not throat. Able to celar secretions and did tolerate soild food earlier. Initially recommended ER for pain.difficulty with swallowing, but after clarification of hx  - decided to try mm relaxant, then if not improving through the night - to proceed to ER for further eval.   Meds ordered this encounter  Medications  . methocarbamol (ROBAXIN) 500 MG tablet    Sig: Take 1 tablet (500 mg total) by mouth every 6 (six) hours as needed for muscle spasms.    Dispense:  10 tablet    Refill:  0   Patient Instructions  Try the muscle relaxant tonight for your neck, but if this does not help the pain with swallowing, or any  worsening of symptoms - be seen in the emergency room for other possible imaging as discussed.   Return to the clinic or go to the nearest emergency room if any of your symptoms worsen or new symptoms occur.  Muscle Cramps and Spasms Muscle cramps and spasms occur when a muscle or muscles tighten and you have no control over this tightening (involuntary muscle contraction). They are a common problem and can develop in any muscle. The most common place is in the calf muscles of the leg. Both muscle cramps and muscle spasms are involuntary muscle contractions, but they also have differences:   Muscle cramps are  sporadic and painful. They may last a few seconds to a quarter of an hour. Muscle cramps are often more forceful and last longer than muscle spasms.  Muscle spasms may or may not be painful. They may also last just a few seconds or much longer. CAUSES  It is uncommon for cramps or spasms to be due to a serious underlying problem. In many cases, the cause of cramps or spasms is unknown. Some common causes are:   Overexertion.   Overuse from repetitive motions (doing the same thing over and over).   Remaining in a certain position for a long period of time.   Improper preparation, form, or technique while performing a sport or activity.   Dehydration.   Injury.   Side effects of some medicines.   Abnormally low levels of the salts and ions in your blood (electrolytes), especially potassium and calcium. This could happen if you are taking water pills (diuretics) or you are pregnant.  Some underlying medical problems can make it more likely to develop cramps or spasms. These include, but are not limited to:   Diabetes.   Parkinson disease.   Hormone disorders, such as thyroid problems.   Alcohol abuse.   Diseases specific to muscles, joints, and bones.   Blood vessel disease where not enough blood is getting to the muscles.  HOME CARE INSTRUCTIONS   Stay well  hydrated. Drink enough water and fluids to keep your urine clear or pale yellow.  It may be helpful to massage, stretch, and relax the affected muscle.  For tight or tense muscles, use a warm towel, heating pad, or hot shower water directed to the affected area.  If you are sore or have pain after a cramp or spasm, applying ice to the affected area may relieve discomfort.  Put ice in a plastic bag.  Place a towel between your skin and the bag.  Leave the ice on for 15-20 minutes, 03-04 times a day.  Medicines used to treat a known cause of cramps or spasms may help reduce their frequency or severity. Only take over-the-counter or prescription medicines as directed by your caregiver. SEEK MEDICAL CARE IF:  Your cramps or spasms get more severe, more frequent, or do not improve over time.  MAKE SURE YOU:   Understand these instructions.  Will watch your condition.  Will get help right away if you are not doing well or get worse.   This information is not intended to replace advice given to you by your health care provider. Make sure you discuss any questions you have with your health care provider.   Document Released: 08/03/2001 Document Revised: 06/08/2012 Document Reviewed: 01/29/2012 Elsevier Interactive Patient Education [employer]2016 Else[employer] Inc[employer]        I personally performed the services described in this documentation, which was scribed in my presence. The recorded information has been reviewed and considered, and addended by me as needed.   By signing my name below, I, Javier Dockerobert Ryan Halas, attest that this documentation has been prepared under the direction and in the presence of Ricky StaggersJeffrey Ellory Khurana, MD. Electronically Signed: Javier Dockerobert Ryan Halas, ER Scribe. 12/28/2014. 8:57 PM.

## 2014-12-28 NOTE — Patient Instructions (Signed)
Try the muscle relaxant tonight for your neck, but if this does not help the pain with swallowing, or any worsening of symptoms - be seen in the emergency room for other possible imaging as discussed.   Return to the clinic or go to the nearest emergency room if any of your symptoms worsen or new symptoms occur.  Muscle Cramps and Spasms Muscle cramps and spasms occur when a muscle or muscles tighten and you have no control over this tightening (involuntary muscle contraction). They are a common problem and can develop in any muscle. The most common place is in the calf muscles of the leg. Both muscle cramps and muscle spasms are involuntary muscle contractions, but they also have differences:   Muscle cramps are sporadic and painful. They may last a few seconds to a quarter of an hour. Muscle cramps are often more forceful and last longer than muscle spasms.  Muscle spasms may or may not be painful. They may also last just a few seconds or much longer. CAUSES  It is uncommon for cramps or spasms to be due to a serious underlying problem. In many cases, the cause of cramps or spasms is unknown. Some common causes are:   Overexertion.   Overuse from repetitive motions (doing the same thing over and over).   Remaining in a certain position for a long period of time.   Improper preparation, form, or technique while performing a sport or activity.   Dehydration.   Injury.   Side effects of some medicines.   Abnormally low levels of the salts and ions in your blood (electrolytes), especially potassium and calcium. This could happen if you are taking water pills (diuretics) or you are pregnant.  Some underlying medical problems can make it more likely to develop cramps or spasms. These include, but are not limited to:   Diabetes.   Parkinson disease.   Hormone disorders, such as thyroid problems.   Alcohol abuse.   Diseases specific to muscles, joints, and bones.    Blood vessel disease where not enough blood is getting to the muscles.  HOME CARE INSTRUCTIONS   Stay well hydrated. Drink enough water and fluids to keep your urine clear or pale yellow.  It may be helpful to massage, stretch, and relax the affected muscle.  For tight or tense muscles, use a warm towel, heating pad, or hot shower water directed to the affected area.  If you are sore or have pain after a cramp or spasm, applying ice to the affected area may relieve discomfort.  Put ice in a plastic bag.  Place a towel between your skin and the bag.  Leave the ice on for 15-20 minutes, 03-04 times a day.  Medicines used to treat a known cause of cramps or spasms may help reduce their frequency or severity. Only take over-the-counter or prescription medicines as directed by your caregiver. SEEK MEDICAL CARE IF:  Your cramps or spasms get more severe, more frequent, or do not improve over time.  MAKE SURE YOU:   Understand these instructions.  Will watch your condition.  Will get help right away if you are not doing well or get worse.   This information is not intended to replace advice given to you by your health care provider. Make sure you discuss any questions you have with your health care provider.   Document Released: 08/03/2001 Document Revised: 06/08/2012 Document Reviewed: 01/29/2012 Elsevier Interactive Patient Education [employer] Elsevier Inc.

## 2016-12-09 ENCOUNTER — Ambulatory Visit (INDEPENDENT_AMBULATORY_CARE_PROVIDER_SITE_OTHER): Payer: Self-pay | Admitting: Physician Assistant

## 2016-12-09 ENCOUNTER — Encounter: Payer: Self-pay | Admitting: Physician Assistant

## 2016-12-09 VITALS — BP 136/90 | HR 110 | Temp 98.7°F | Resp 16 | Ht 68.25 in | Wt 212.2 lb

## 2016-12-09 DIAGNOSIS — R81 Glycosuria: Secondary | ICD-10-CM

## 2016-12-09 DIAGNOSIS — Z0289 Encounter for other administrative examinations: Secondary | ICD-10-CM

## 2016-12-09 LAB — POCT GLYCOSYLATED HEMOGLOBIN (HGB A1C): HEMOGLOBIN A1C: 8.6

## 2016-12-09 NOTE — Progress Notes (Signed)
   Airline pilot Medical Examination   Ricky Glass is a 51 y.o. male with a pertinent medical history of gout who presents today for a commercial driver fitness determination physical exam. The patient reports no problems today. In the past the patient reports receiving 2 year certificates. He denies focal neurological deficits, vision and hearing changes. He denies the habitual use of benzodiazepines, opioids, amphetamines and denies illicit drug use.   Current medications, family history, allergies, social history reviewed by me and exist elsewhere in the encounter.   Review of Systems  Constitutional: Negative for chills, diaphoresis and fever.  Eyes: Negative.   Respiratory: Negative for cough, hemoptysis, sputum production, shortness of breath and wheezing.   Cardiovascular: Negative for chest pain, orthopnea and leg swelling.  Gastrointestinal: Negative for nausea.  Skin: Negative for rash.  Neurological: Negative for dizziness, sensory change, speech change, focal weakness and headaches.  Endo/Heme/Allergies: Negative for polydipsia.    Objective:     Vision/hearing:  Visual Acuity Screening   Right eye Left eye Both eyes  Without correction:  With correction:     Comments: Color screen passed Field of vision: 85 degrees bilateral  Hearing Screening Comments: Whisper test: 10 feet bilateral  Applicant can recognize and distinguish among traffic control signals and devices showing standard red, green, and amber colors.  Corrective lenses required: No  Monocular Vision?: No  Hearing aid requirement: No  Physical Exam  Constitutional: He appears well-developed. He is active and cooperative.  Non-toxic appearance.  Cardiovascular: Normal rate, regular rhythm, S1 normal, S2 normal, normal heart sounds, intact distal pulses and normal pulses.  Exam reveals no gallop and no friction rub.   No murmur heard. Pulmonary/Chest: Effort normal. No  stridor. No tachypnea. No respiratory distress. He has no wheezes. He has no rales.  Abdominal: He exhibits no distension.  Musculoskeletal: He exhibits no edema.  Neurological: He is alert.  Skin: Skin is warm and dry. He is not diaphoretic. No pallor.  Vitals reviewed.   BP 136/90 (BP Location: Left Arm, Patient Position: Sitting, Cuff Size: Normal)   Pulse (!) 110   Temp 98.7 F (37.1 C) (Oral)   Resp 16   Ht 5' 8.25" (1.734 m)   Wt 212 lb 3.2 oz (96.3 kg)   SpO2 96%   BMI 32.03 kg/m   Labs: Comments: UA: SG-1.020; Blood-Neg; Glucose-250 mg; Protein- Neg    Assessment:    Healthy male exam.  Meets standards, but periodic monitoring required due to uncontrolled diabetes. His care is directed back to his PCP. I will see him back in three months for new exam and will hope tor write him out for a year at that time.  Driver qualified only for 3 months.    Plan:    Medical examiners certificate completed and printed. Return as needed.    Deliah Boston, MS, PA-C 12:29 PM, 12/09/2016

## 2016-12-09 NOTE — Patient Instructions (Signed)
     IF you received an x-ray today, you will receive an invoice from [facility]. Please contact [facility] at [phone] with questions or concerns regarding your invoice.   IF you received labwork today, you will receive an invoice from [facility]. Please contact [facility] at [phone] with questions or concerns regarding your invoice.   Our billing staff will not be able to assist you with questions regarding bills from these companies.  You will be contacted with the lab results as soon as they are available. The fastest way to get your results is to activate your My Chart account. Instructions are located on the last page of this paperwork. If you have not heard from us regarding the results in 2 weeks, please contact this office.     

## 2017-06-10 ENCOUNTER — Ambulatory Visit (INDEPENDENT_AMBULATORY_CARE_PROVIDER_SITE_OTHER): Payer: Self-pay

## 2017-06-10 ENCOUNTER — Encounter (HOSPITAL_COMMUNITY): Payer: Self-pay | Admitting: Emergency Medicine

## 2017-06-10 ENCOUNTER — Ambulatory Visit (HOSPITAL_COMMUNITY)
Admission: EM | Admit: 2017-06-10 | Discharge: 2017-06-10 | Disposition: A | Discharge status: Home / Self Care | Payer: Self-pay | Attending: Family Medicine | Admitting: Family Medicine

## 2017-06-10 DIAGNOSIS — W07XXXA Fall from chair, initial encounter: Secondary | ICD-10-CM

## 2017-06-10 DIAGNOSIS — M25521 Pain in right elbow: Secondary | ICD-10-CM

## 2017-06-10 MED ORDER — IBUPROFEN 800 MG PO TABS: 800.0000 mg | ORAL_TABLET | Freq: Three times a day (TID) | ORAL | 0 refills | Status: DC

## 2017-06-10 NOTE — ED Provider Notes (Signed)
[facility]    CSN: [id number] Arrival date & time: 06/10/17  1551     History   Chief Complaint Chief Complaint  Patient presents with  . Arm Pain    HPI Ricky Glass is a 52 y.o. male presenting today with right elbow pain.  States that he fell out of his chair at work and landed on his right elbow.  Since he has had pain and limited movement.  He also states that he has has gout and had a recent attack recently.  Denies any numbness or tingling.  HPI  Past Medical History:  Diagnosis Date  . Gout     There are no active problems to display for this patient.   History reviewed. No pertinent surgical history.     Home Medications    Prior to Admission medications   Medication Sig Start Date End Date Taking? Authorizing Provider  allopurinol (ZYLOPRIM) 100 MG tablet Take 100 mg by mouth daily.    [provider]  ibuprofen (ADVIL,MOTRIN) 800 MG tablet Take 1 tablet (800 mg total) by mouth 3 (three) times daily. 06/10/17   Wieters, Junius Creamer, PA-C    Family History No family history on file.  Social History Social History   Tobacco Use  . Smoking status: Never Smoker  . Smokeless tobacco: Never Used  Substance Use Topics  . Alcohol use: No    Alcohol/week: 0.0 oz  . Drug use: No     Allergies   Aspirin   Review of Systems Review of Systems  Constitutional: Negative for fatigue and fever.  Respiratory: Negative for shortness of breath.   Cardiovascular: Negative for chest pain.  Gastrointestinal: Negative for nausea and vomiting.  Musculoskeletal: Positive for arthralgias and myalgias. Negative for back pain and gait problem.  Skin: Negative for color change and wound.  Neurological: Negative for weakness, light-headedness and numbness.     Physical Exam Triage Vital Signs ED Triage Vitals [06/10/17 1558]  Enc Vitals Group     BP (!) 160/107     Pulse Rate 86     Resp 18     Temp 98.1 F (36.7 C)     Temp src    SpO2 99 %     Weight      Height      Head Circumference      Peak Flow      Pain Score      Pain Loc      Pain Edu?      Excl. in GC?    No data found.  Updated Vital Signs BP (!) 160/107   Pulse 86   Temp 98.1 F (36.7 C)   Resp 18   SpO2 99%   Visual Acuity Right Eye Distance:   Left Eye Distance:   Bilateral Distance:    Right Eye Near:   Left Eye Near:    Bilateral Near:     Physical Exam  Constitutional: He appears well-developed and well-nourished.  HENT:  Head: Normocephalic and atraumatic.  Eyes: Conjunctivae are normal.  Neck: Neck supple.  Cardiovascular: Normal rate.  Pulmonary/Chest: Effort normal. No respiratory distress.  Musculoskeletal: He exhibits no edema.  Mild swelling to right elbow, tenderness to palpation near medial and lateral aspects of elbow.  Patient has full active range of motion from 180-60 degrees.  He will pulse 2+, sensation intact distally, cap refill less than 2 seconds.  Neurological: He is alert.  Skin: Skin is warm and  dry.  Psychiatric: He has a normal mood and affect.  Nursing note and vitals reviewed.    UC Treatments / Results  Labs (all labs ordered are listed, but only abnormal results are displayed) Labs Reviewed - No data to display  EKG None Radiology Dg Elbow Complete Right (3+view)  Result Date: 06/10/2017 CLINICAL DATA:  Pain following fall EXAM: RIGHT ELBOW - COMPLETE 3+ VIEW COMPARISON:  Jun 27, 2014 FINDINGS: Frontal, lateral, and bilateral oblique views were obtained. No fracture or dislocation. No joint effusion. No appreciable joint space narrowing or erosion. There is a small spur along the coracoid process of the proximal ulna. IMPRESSION: Small spur along coracoid process of proximal ulna. No appreciable joint space narrowing or erosion. No fracture or dislocation. Electronically Signed   By: Bretta BangWilliam  Woodruff III M.D.   On: 06/10/2017 16:14    Procedures Procedures (including critical care  time)  Medications Ordered in UC Medications - No data to display   Initial Impression / Assessment and Plan / UC Course  I have reviewed the triage vital signs and the nursing notes.  Pertinent labs & imaging results that were available during my care of the patient were reviewed by me and considered in my medical decision making (see chart for details).     X-ray negative for fracture or dislocation.  Patient is neurovascular intact.  Likely elbow contusion.  Will recommend conservative treatment with rest, ice and NSAIDs.  Ibuprofen 800 refilled-patient takes this for gout and is almost out.. Discussed strict return precautions. Patient verbalized understanding and is agreeable with plan.   Final Clinical Impressions(s) / UC Diagnoses   Final diagnoses:  Right elbow pain    ED Discharge Orders        Ordered    ibuprofen (ADVIL,MOTRIN) 800 MG tablet  3 times daily     06/10/17 1623       Controlled Substance Prescriptions [place] Controlled Substance Registry consulted? Not Applicable   Lew DawesWieters, Hallie C, [place]PA-C 06/10/17 1627

## 2017-06-10 NOTE — ED Triage Notes (Signed)
Pt states today at work he fell out of his chair and landed on his R elbow. C/o R elbow pain, limited ROM due to pain.

## 2017-06-10 NOTE — Discharge Instructions (Signed)
No fracture seen on x-ray.  Please rest your elbow, apply ice multiple times a day.  Ibuprofen and Tylenol for pain and swelling.

## 2017-10-09 ENCOUNTER — Emergency Department (HOSPITAL_BASED_OUTPATIENT_CLINIC_OR_DEPARTMENT_OTHER)
Admission: EM | Admit: 2017-10-09 | Discharge: 2017-10-09 | Disposition: A | Discharge status: Home / Self Care | Payer: Self-pay | Attending: Emergency Medicine | Admitting: Emergency Medicine

## 2017-10-09 ENCOUNTER — Other Ambulatory Visit: Payer: Self-pay

## 2017-10-09 ENCOUNTER — Encounter (HOSPITAL_BASED_OUTPATIENT_CLINIC_OR_DEPARTMENT_OTHER): Payer: Self-pay

## 2017-10-09 DIAGNOSIS — R739 Hyperglycemia, unspecified: Secondary | ICD-10-CM

## 2017-10-09 DIAGNOSIS — Z79899 Other long term (current) drug therapy: Secondary | ICD-10-CM | POA: Insufficient documentation

## 2017-10-09 DIAGNOSIS — E1165 Type 2 diabetes mellitus with hyperglycemia: Secondary | ICD-10-CM | POA: Insufficient documentation

## 2017-10-09 HISTORY — DX: Type 2 diabetes mellitus without complications: E11.9

## 2017-10-09 LAB — URINALYSIS, MICROSCOPIC (REFLEX): WBC, UA: NONE SEEN WBC/hpf (ref 0–5)

## 2017-10-09 LAB — URINALYSIS, ROUTINE W REFLEX MICROSCOPIC
BILIRUBIN URINE: NEGATIVE
Hgb urine dipstick: NEGATIVE
KETONES UR: NEGATIVE mg/dL
Leukocytes, UA: NEGATIVE
Nitrite: NEGATIVE
PH: 5.5 (ref 5.0–8.0)
Protein, ur: NEGATIVE mg/dL
SPECIFIC GRAVITY, URINE: 1.01 (ref 1.005–1.030)

## 2017-10-09 LAB — CBC WITH DIFFERENTIAL/PLATELET
BASOS ABS: 0.1 10*3/uL (ref 0.0–0.1)
BASOS PCT: 1 %
Eosinophils Absolute: 0.1 10*3/uL (ref 0.0–0.7)
Eosinophils Relative: 1 %
HEMATOCRIT: 42.7 % (ref 39.0–52.0)
HEMOGLOBIN: 14.4 g/dL (ref 13.0–17.0)
Lymphocytes Relative: 33 %
Lymphs Abs: 3.5 10*3/uL (ref 0.7–4.0)
MCH: 27.1 pg (ref 26.0–34.0)
MCHC: 33.7 g/dL (ref 30.0–36.0)
MCV: 80.3 fL (ref 78.0–100.0)
MONOS PCT: 8 %
Monocytes Absolute: 0.8 10*3/uL (ref 0.1–1.0)
NEUTROS ABS: 6.2 10*3/uL (ref 1.7–7.7)
NEUTROS PCT: 57 %
Platelets: 207 10*3/uL (ref 150–400)
RBC: 5.32 MIL/uL (ref 4.22–5.81)
RDW: 14.1 % (ref 11.5–15.5)
WBC: 10.7 10*3/uL — AB (ref 4.0–10.5)

## 2017-10-09 LAB — COMPREHENSIVE METABOLIC PANEL
ALBUMIN: 3.7 g/dL (ref 3.5–5.0)
ALK PHOS: 61 U/L (ref 38–126)
ALT: 19 U/L (ref 0–44)
AST: 17 U/L (ref 15–41)
Anion gap: 10 (ref 5–15)
BILIRUBIN TOTAL: 0.7 mg/dL (ref 0.3–1.2)
BUN: 23 mg/dL — AB (ref 6–20)
CALCIUM: 8.5 mg/dL — AB (ref 8.9–10.3)
CO2: 25 mmol/L (ref 22–32)
Chloride: 99 mmol/L (ref 98–111)
Creatinine, Ser: 1.65 mg/dL — ABNORMAL HIGH (ref 0.61–1.24)
GFR calc Af Amer: 55 mL/min — ABNORMAL LOW (ref >60)
GFR calc non Af Amer: 47 mL/min — ABNORMAL LOW (ref >60)
GLUCOSE: 416 mg/dL — AB (ref 70–99)
Potassium: 4 mmol/L (ref 3.5–5.1)
Sodium: 134 mmol/L — ABNORMAL LOW (ref 135–145)
TOTAL PROTEIN: 6.8 g/dL (ref 6.5–8.1)

## 2017-10-09 LAB — I-STAT VENOUS BLOOD GAS, ED
ACID-BASE EXCESS: 3 mmol/L — AB (ref 0.0–2.0)
BICARBONATE: 28 mmol/L (ref 20.0–28.0)
O2 Saturation: 90 %
PO2 VEN: 56 mmHg — AB (ref 32.0–45.0)
Patient temperature: 98.2
TCO2: 29 mmol/L (ref 22–32)
pCO2, Ven: 41 mmHg — ABNORMAL LOW (ref 44.0–60.0)
pH, Ven: 7.442 — ABNORMAL HIGH (ref 7.250–7.430)

## 2017-10-09 LAB — LIPASE, BLOOD: Lipase: 34 U/L (ref 11–51)

## 2017-10-09 LAB — CBG MONITORING, ED
GLUCOSE-CAPILLARY: 418 mg/dL — AB (ref 70–99)
GLUCOSE-CAPILLARY: 316 mg/dL — AB (ref 70–99)

## 2017-10-09 MED ORDER — SODIUM CHLORIDE 0.9 % IV BOLUS
1000.0000 mL | Freq: Once | INTRAVENOUS | Status: AC
  Administered 2017-10-09: 1000 mL via INTRAVENOUS

## 2017-10-09 NOTE — ED Provider Notes (Signed)
[facility] EM[facility]GENCY DEPARTMENT Provider Note   CSN: [id number] Arrival date & time: 10/09/17  2026     History   Chief Complaint Chief Complaint  Patient presents with  . Hyperglycemia    HPI Ricky Glass is a 52 y.o. male.  The history is provided by the patient.  Hyperglycemia  Blood sugar level PTA:  >500 Severity:  Severe Onset quality:  Gradual Duration:  1 week Timing:  Constant Progression:  Waxing and waning Chronicity:  New Diabetes status:  Controlled with diet Context: change in medication (steroids)   Relieved by:  Nothing Ineffective treatments:  None tried Associated symptoms: polyuria   Associated symptoms: no abdominal pain, no chest pain, no confusion, no dehydration, no diaphoresis, no dizziness, no dysuria, no fatigue, no increased appetite, no increased thirst, no nausea, no shortness of breath, no syncope, no vomiting and no weakness   Risk factors: recent steroid use   Risk factors: no hx of DKA     Past Medical History:  Diagnosis Date  . Diabetes mellitus without complication (HCC)   . Gout     There are no active problems to display for this patient.   History reviewed. No pertinent surgical history.      Home Medications    Prior to Admission medications   Medication Sig Start Date End Date Taking? Authorizing Provider  allopurinol (ZYLOPRIM) 100 MG tablet Take 100 mg by mouth daily.    [provider]  ibuprofen (ADVIL,MOTRIN) 800 MG tablet Take 1 tablet (800 mg total) by mouth 3 (three) times daily. 06/10/17   Wieters, Junius CreamerHallie C, PA-C    Family History No family history on file.  Social History Social History   Tobacco Use  . Smoking status: Never Smoker  . Smokeless tobacco: Never Used  Substance Use Topics  . Alcohol use: No    Alcohol/week: 0.0 standard drinks  . Drug use: No     Allergies   Aspirin   Review of Systems Review of Systems  Constitutional: Negative for chills,  diaphoresis and fatigue.  HENT: Negative for congestion.   Respiratory: Negative for cough, chest tightness and shortness of breath.   Cardiovascular: Negative for chest pain and syncope.  Gastrointestinal: Negative for abdominal pain, constipation, diarrhea, nausea and vomiting.  Endocrine: Positive for polyuria. Negative for polydipsia.  Genitourinary: Positive for frequency. Negative for dysuria.  Musculoskeletal: Negative for back pain and neck pain.  Neurological: Negative for dizziness, weakness and light-headedness.  Psychiatric/Behavioral: Negative for confusion.  All other systems reviewed and are negative.    Physical Exam Updated Vital Signs BP (!) 150/103 (BP Location: Right Arm)   Pulse 100   Temp 98.2 F (36.8 C)   Resp 17   Ht 5\' 9"  (1.753 m)   Wt 95.3 kg   SpO2 96%   BMI 31.01 kg/m   Physical Exam  Constitutional: He is oriented to person, place, and time. He appears well-developed and well-nourished. No distress.  HENT:  Head: Normocephalic and atraumatic.  Nose: Nose normal.  Mouth/Throat: Oropharynx is clear and moist. No oropharyngeal exudate.  Eyes: Pupils are equal, round, and reactive to light. Conjunctivae and EOM are normal.  Neck: Normal range of motion. Neck supple.  Cardiovascular: Normal rate and regular rhythm.  No murmur heard. Pulmonary/Chest: Effort normal and breath sounds normal. No respiratory distress. He has no wheezes. He has no rales. He exhibits no tenderness.  Abdominal: Soft. He exhibits no distension. There is no tenderness. There  is no guarding.  Musculoskeletal: He exhibits no edema or tenderness.  Neurological: He is alert and oriented to person, place, and time. No sensory deficit. He exhibits normal muscle tone.  Skin: Skin is warm and dry. Capillary refill takes less than 2 seconds. He is not diaphoretic. No erythema. No pallor.  Psychiatric: He has a normal mood and affect.  Nursing note and vitals reviewed.    ED  Treatments / Results  Labs (all labs ordered are listed, but only abnormal results are displayed) Labs Reviewed  CBC WITH DIFFERENTIAL/PLATELET - Abnormal; Notable for the following components:      Result Value   WBC 10.7 (*)    All other components within normal limits  COMPREHENSIVE METABOLIC PANEL - Abnormal; Notable for the following components:   Sodium 134 (*)    Glucose, Bld 416 (*)    BUN 23 (*)    Creatinine, Ser 1.65 (*)    Calcium 8.5 (*)    GFR calc non Af Amer 47 (*)    GFR calc Af Amer 55 (*)    All other components within normal limits  URINALYSIS, ROUTINE W REFLEX MICROSCOPIC - Abnormal; Notable for the following components:   Glucose, UA >=500 (*)    All other components within normal limits  URINALYSIS, MICROSCOPIC (REFLEX) - Abnormal; Notable for the following components:   Bacteria, UA RARE (*)    All other components within normal limits  CBG MONITORING, ED - Abnormal; Notable for the following components:   Glucose-Capillary 418 (*)    All other components within normal limits  I-STAT VENOUS BLOOD GAS, ED - Abnormal; Notable for the following components:   pH, Ven 7.442 (*)    pCO2, Ven 41.0 (*)    pO2, Ven 56.0 (*)    Acid-Base Excess 3.0 (*)    All other components within normal limits  CBG MONITORING, ED - Abnormal; Notable for the following components:   Glucose-Capillary 316 (*)    All other components within normal limits  URINE CULTURE  LIPASE, BLOOD  BLOOD GAS, VENOUS    EKG None  Radiology No results found.  Procedures Procedures (including critical care time)  Medications Ordered in ED Medications  sodium chloride 0.9 % bolus 1,000 mL (0 mLs Intravenous Stopped 10/09/17 2259)     Initial Impression / Assessment and Plan / ED Course  I have reviewed the triage vital signs and the nursing notes.  Pertinent labs & imaging results that were available during my care of the patient were reviewed by me and considered in my medical  decision making (see chart for details).     Ricky Glass is a 52 y.o. male with a past medical history significant for diabetes managed with diet and gout who presents with urinary frequency and hyperglycemia.  Patient reports that for the last week his sugars have been more elevated and was found to be over 500 today prompting him to seek evaluation.  Of note, patient reports that he was started on steroids 1/2 weeks ago for a gout flare.  He says he does not take any medicine for his diabetes has not yet had to do so.  He denies any nausea vomiting, abdominal pain, cough, congestion, lightheadedness or other symptoms.  No chest pain, shortness of breath, or respiratory symptoms.  On exam, abdomen is nontender.  Glucose found to be in the 400s.  Lungs clear chest is nontender.  Patient appears well.  Given the patient's elevated glucose, patient  will have labs to look for DKA or occult infection.    Urinalysis did not show convincing evidence of UTI.  Laboratory testing showed hyperglycemia that was improving with fluids.    No evidence of DKA on labs.  Suspect the patient's steroids for the gout is the cause of his glucose elevation.  Patient reports tomorrow is his last day and he will stop it tonight.  He will follow-up with his PCP in the next several days for reassessment and will continue to trend his glucoses.  Given lack of other symptoms and well appearance, patient was felt stable for discharge home with likely hyperglycemia secondary to steroid use.  Patient understood return precautions and was discharged in good condition.   Final Clinical Impressions(s) / ED Diagnoses   Final diagnoses:  Hyperglycemia    ED Discharge Orders    None     Clinical Impression: 1. Hyperglycemia     Disposition: Discharge  Condition: Good  I have discussed the results, Dx and Tx plan with the pt(& family if present). He/she/they expressed understanding and agree(s) with the plan.  Discharge instructions discussed at great length. Strict return precautions discussed and pt &/or family have verbalized understanding of the instructions. No further questions at time of discharge.    Discharge Medication List as of 10/09/2017 11:25 PM      Follow Up: Billee Cashing, MD [address] [place] [address] [phone]     [facility] [address] [id number] [address] [facility] [place] [address] [phone]       Vyla Pint, Canary Brim, MD 10/10/17 [phone]

## 2017-10-09 NOTE — Discharge Instructions (Signed)
We suspect her hyperglycemia today was due to the steroid you been using for the last week and a half.  Please stop taking them.  Please follow-up with your primary doctor in the next week for reassessment further management.  Your kidney function was elevated today which is why we gave you more fluids.  Your glucose was improving.  Given your lack of other symptoms and her well appearance, we feel you are safe for discharge home.  Please go to the nearest emergency department if you have any new or worsened symptoms.

## 2017-10-09 NOTE — ED Notes (Signed)
Pt c/o elevated BG from being on prednisone for his gout

## 2017-10-09 NOTE — ED Notes (Signed)
CBG 418 in triage

## 2017-10-09 NOTE — ED Triage Notes (Signed)
Pt c/o elevated BS this week-dx DM-no meds-diet controlled-NAD-steady gait

## 2017-10-09 NOTE — ED Notes (Signed)
Pt verbalizes understanding of d/c instructions and denies any further needs at this time. 

## 2017-10-11 LAB — URINE CULTURE: Culture: NO GROWTH

## 2018-03-12 ENCOUNTER — Encounter (HOSPITAL_BASED_OUTPATIENT_CLINIC_OR_DEPARTMENT_OTHER): Payer: Self-pay

## 2018-03-12 ENCOUNTER — Other Ambulatory Visit: Payer: Self-pay

## 2018-03-12 ENCOUNTER — Emergency Department (HOSPITAL_BASED_OUTPATIENT_CLINIC_OR_DEPARTMENT_OTHER): Payer: Self-pay

## 2018-03-12 ENCOUNTER — Emergency Department (HOSPITAL_BASED_OUTPATIENT_CLINIC_OR_DEPARTMENT_OTHER)
Admission: EM | Admit: 2018-03-12 | Discharge: 2018-03-12 | Disposition: A | Discharge status: Home / Self Care | Payer: Self-pay | Attending: Emergency Medicine | Admitting: Emergency Medicine

## 2018-03-12 DIAGNOSIS — E119 Type 2 diabetes mellitus without complications: Secondary | ICD-10-CM | POA: Insufficient documentation

## 2018-03-12 DIAGNOSIS — M5442 Lumbago with sciatica, left side: Secondary | ICD-10-CM

## 2018-03-12 DIAGNOSIS — Z79899 Other long term (current) drug therapy: Secondary | ICD-10-CM | POA: Insufficient documentation

## 2018-03-12 DIAGNOSIS — M7122 Synovial cyst of popliteal space [Baker], left knee: Secondary | ICD-10-CM

## 2018-03-12 DIAGNOSIS — M79605 Pain in left leg: Secondary | ICD-10-CM | POA: Insufficient documentation

## 2018-03-12 MED ORDER — METHOCARBAMOL 500 MG PO TABS: 500.0000 mg | ORAL_TABLET | Freq: Every evening | ORAL | 0 refills | Status: DC | PRN

## 2018-03-12 MED ORDER — KETOROLAC TROMETHAMINE 15 MG/ML IJ SOLN
15.0000 mg | Freq: Once | INTRAMUSCULAR | Status: AC
  Administered 2018-03-12: 15 mg via INTRAMUSCULAR
  Filled 2018-03-12: qty 1

## 2018-03-12 MED ORDER — NAPROXEN 500 MG PO TABS: 500.0000 mg | ORAL_TABLET | Freq: Two times a day (BID) | ORAL | 0 refills | Status: DC

## 2018-03-12 NOTE — ED Provider Notes (Signed)
[facility] Provider Note   CSN: [id number] Arrival date & time: 03/12/18  1555     History   Chief Complaint Chief Complaint  Patient presents with  . Hip Pain    HPI Ricky Glass is a 53 y.o. male presenting for evaluation of left leg pain.  Patient states 2 days ago, his left knee became swollen.  He took a prednisone, and swelling improved, but since then he has developed pain of his left upper leg, hip, and low back.  He denies fall, trauma, or injury.  He denies pain on the right side.  He denies numbness or tingling.  He denies history of similar.  He reports no midline back pain.  He denies fevers, chills, rash, neck pain, loss of bowel bladder control, history of cancer, history of IV drug use.  Patient reports a history of gout and diabetes which is controlled with diet.  He denies recent travel, surgeries, immobilization, history of previous DVT/PE, or hormone use.  Patient reports his pain is worse with movement.  He had some improvement with prednisone and using a crutch.  He denies urinary symptoms.  HPI  Past Medical History:  Diagnosis Date  . Diabetes mellitus without complication (HCC)   . Gout     There are no active problems to display for this patient.   History reviewed. No pertinent surgical history.      Home Medications    Prior to Admission medications   Medication Sig Start Date End Date Taking? Authorizing Provider  allopurinol (ZYLOPRIM) 100 MG tablet Take 100 mg by mouth daily.    [provider]  ibuprofen (ADVIL,MOTRIN) 800 MG tablet Take 1 tablet (800 mg total) by mouth 3 (three) times daily. 06/10/17   Wieters, Hallie C, PA-C  methocarbamol (ROBAXIN) 500 MG tablet Take 1 tablet (500 mg total) by mouth at bedtime as needed for muscle spasms. 03/12/18   Kirkland Figg, PA-C  naproxen (NAPROSYN) 500 MG tablet Take 1 tablet (500 mg total) by mouth 2 (two) times daily with a meal. 03/12/18   Betania Dizon,  Kroy Sprung, PA-C    Family History No family history on file.  Social History Social History   Tobacco Use  . Smoking status: Never Smoker  . Smokeless tobacco: Never Used  Substance Use Topics  . Alcohol use: No    Alcohol/week: 0.0 standard drinks  . Drug use: No     Allergies   Aspirin   Review of Systems Review of Systems  Musculoskeletal: Positive for arthralgias, back pain and myalgias.  Hematological: Does not bruise/bleed easily.     Physical Exam Updated Vital Signs BP (!) 145/107 (BP Location: Left Arm)   Pulse 83   Temp 97.8 F (36.6 C) (Oral)   Resp 18   Ht 5\' 9"  (1.753 m)   Wt 94.8 kg   SpO2 100%   BMI 30.86 kg/m   Physical Exam Vitals signs and nursing note reviewed.  Constitutional:      General: He is not in acute distress.    Appearance: He is well-developed.     Comments: Laying in the bed in NAD  HENT:     Head: Normocephalic and atraumatic.  Neck:     Musculoskeletal: Normal range of motion.  Pulmonary:     Effort: Pulmonary effort is normal.  Abdominal:     General: There is no distension.  Musculoskeletal: Normal range of motion.        General: Swelling  and tenderness present.     Comments: Tenderness palpation of left low back musculature and buttock.  Tenderness palpation of left hamstring and posterior knee.  Minimal swelling of the left knee, question mild swelling of the left lower leg.  Pedal pulses intact bilaterally.  Sensation intact bilaterally.  No erythema or warmth.  No increased pain with straight leg raise on the left side.  No saddle paresthesia.  Patellar reflexes intact.  No tenderness palpation of midline spine.  Skin:    General: Skin is warm.     Capillary Refill: Capillary refill takes less than 2 seconds.     Findings: No rash.  Neurological:     Mental Status: He is alert and oriented to person, place, and time.     Deep Tendon Reflexes: Reflexes normal.      ED Treatments / Results  Labs (all labs  ordered are listed, but only abnormal results are displayed) Labs Reviewed - No data to display  EKG None  Radiology US Venous Img Lower  Left (dvt Study)  Result Date: 03/12/2018 CLINICAL DATA:  LEFT knee pain and LEFT leg swelling for 3 days, LEFT hip pain for 1 day EXAM: LEFT LOWER EXTREMITY VENOUS DOPPLER ULTRASOUND TECHNIQUE: Gray-scale sonography with graded compression, as well as color Doppler and duplex ultrasound were performed to evaluate the lower extremity deep venous systems from the level of the common femoral vein and including the common femoral, femoral, profunda femoral, popliteal and calf veins including the posterior tibial, peroneal and gastrocnemius veins when visible. The superficial great saphenous vein was also interrogated. Spectral Doppler was utilized to evaluate flow at rest and with distal augmentation maneuvers in the common femoral, femoral and popliteal veins. COMPARISON:  None FINDINGS: Contralateral Common Femoral Vein: Respiratory phasicity is normal and symmetric with the symptomatic side. No evidence of thrombus. Normal compressibility. Common Femoral Vein: No evidence of thrombus. Normal compressibility, respiratory phasicity and response to augmentation. Saphenofemoral Junction: No evidence of thrombus. Normal compressibility and flow on color Doppler imaging. Profunda Femoral Vein: No evidence of thrombus. Normal compressibility and flow on color Doppler imaging. Femoral Vein: No evidence of thrombus. Normal compressibility, respiratory phasicity and response to augmentation. Popliteal Vein: No evidence of thrombus. Normal compressibility, respiratory phasicity and response to augmentation. Calf Veins: No evidence of thrombus. Normal compressibility and flow on color Doppler imaging. Superficial Great Saphenous Vein: No evidence of thrombus. Normal compressibility. Venous Reflux:  None. Other Findings: Small Baker cyst at LEFT popliteal fossa 4.9 x 1.0 x 3.0 cm.  IMPRESSION: No evidence of deep venous thrombosis in the LEFT lower extremity. LEFT Baker's cyst. Electronically Signed   By: Ulyses Southward M.D.   On: 03/12/2018 18:06    Procedures Procedures (including critical care time)  Medications Ordered in ED Medications  ketorolac (TORADOL) 15 MG/ML injection 15 mg (15 mg Intramuscular Given 03/12/18 1826)     Initial Impression / Assessment and Plan / ED Course  I have reviewed the triage vital signs and the nursing notes.  Pertinent labs & imaging results that were available during my care of the patient were reviewed by me and considered in my medical decision making (see chart for details).     Patient presenting for evaluation of left low back/hip/leg pain.  Physical exam is patient is afebrile not tachycardic.  Appears nontoxic.  He is neurovascularly intact.  No red flags of back pain, as such doubt spinal cord compression, myelopathy, cauda equina syndrome, vertebral injury, or fracture.  I do not believe x-rays are necessary at this time.  Symptoms began with a swollen knee, and now developed into back pain.  Consider possible MSK pain after favoring left knee.  However, as it started with swelling and patient has some mild swelling currently, consider possibility of DVT despite no risk factors.  Will obtain ultrasound for further evaluation.   Ultrasound negative for DVT, does show a Baker's cyst.  Discussed findings with patient.  Discussed likely MSK pain, possible sciatica.  Per chart review, patient had hyperglycemia with prednisone previously, as such we will hold on steroids.  Instead, will treat with NSAIDs, muscle relaxers, and muscle cream/patches.  Discussed importance of stretching and heat.  Discussed follow-up with PCP.  At this time, patient appears safe for discharge.  Return precautions given.  Patient states he understands and agrees to plan.   Final Clinical Impressions(s) / ED Diagnoses   Final diagnoses:  Acute  left-sided low back pain with left-sided sciatica  Baker's cyst of knee, left    ED Discharge Orders         Ordered    naproxen (NAPROSYN) 500 MG tablet  2 times daily with meals     03/12/18 1820    methocarbamol (ROBAXIN) 500 MG tablet  At bedtime PRN     03/12/18 1820           Alveria ApleyCaccavale, Rio Taber, PA-C 03/12/18 1840    Sabas SousBero, Michael M, MD 03/12/18 [phone]

## 2018-03-12 NOTE — ED Notes (Signed)
Pt transported to US

## 2018-03-12 NOTE — ED Notes (Signed)
Pt verbalizes understanding of d/c instructions and denies any further needs at this time. 

## 2018-03-12 NOTE — ED Triage Notes (Signed)
Pt states he woke 2 dasy ago with swelling to left knee-woke yesterday with pain to left hip, lower back-numbness/tingling left LE-pt to triage in w/c-drove self to ED

## 2018-03-12 NOTE — Discharge Instructions (Addendum)
Take naproxen 2 times a day with meals.  Do not take other anti-inflammatories at the same time open (Advil, Motrin, ibuprofen, Aleve). You may supplement with Tylenol if you need further pain control. Use Robaxin as needed for muscle stiffness or soreness. Have caution, as this may make you tired or groggy. Do not drive or operate heavy machinery while taking this medication.  Use muscle creams (bengay, icy hot, salonpas) as needed for pain.  The pharmacist about lidocaine patches to help with pain. Use heating pads in the back stretches that are in the paperwork to help with symptom control. Follow up with your primary care doctor if pain is not improving with this treatment in 1-2 weeks.  Return to the ER if you develop high fevers, numbness, loss of bowel or bladder control, or any new or concerning symptoms.

## 2018-03-13 ENCOUNTER — Emergency Department (HOSPITAL_COMMUNITY)
Admission: EM | Admit: 2018-03-13 | Discharge: 2018-03-13 | Disposition: A | Discharge status: Home / Self Care | Payer: Self-pay | Attending: Emergency Medicine | Admitting: Emergency Medicine

## 2018-03-13 DIAGNOSIS — M5410 Radiculopathy, site unspecified: Secondary | ICD-10-CM

## 2018-03-13 DIAGNOSIS — M79605 Pain in left leg: Secondary | ICD-10-CM | POA: Insufficient documentation

## 2018-03-13 DIAGNOSIS — Z79899 Other long term (current) drug therapy: Secondary | ICD-10-CM | POA: Insufficient documentation

## 2018-03-13 DIAGNOSIS — E119 Type 2 diabetes mellitus without complications: Secondary | ICD-10-CM | POA: Insufficient documentation

## 2018-03-13 DIAGNOSIS — M25552 Pain in left hip: Secondary | ICD-10-CM | POA: Insufficient documentation

## 2018-03-13 MED ORDER — PREDNISONE 20 MG PO TABS: ORAL_TABLET | ORAL | 0 refills | Status: DC

## 2018-03-13 MED ORDER — TRAMADOL HCL 50 MG PO TABS: 50.0000 mg | ORAL_TABLET | Freq: Four times a day (QID) | ORAL | 0 refills | Status: DC | PRN

## 2018-03-13 NOTE — ED Provider Notes (Signed)
[facility]   CSN: [id number] Arrival date & time: 03/13/18  [id number]     History   Chief Complaint Chief Complaint  Patient presents with  . Hip Pain    HPI Ricky Glass is a 53 y.o. male.  The history is provided by the patient and medical records. No language interpreter was used.  Hip Pain      53 year old male with history of gout presenting complaining of left hip pain.  Patient report for the past 2 3 days he has had progressive worsening pain involving his left hip and leg.  Pain is described as a sharp throbbing sensation without any significant midline back spine pain, fever or chills, bowel bladder incontinence or saddle anesthesia.  He denies any recent injury.  He was seen in the ED yesterday for his complaint.  A DVT study of his left leg was performed showing no evidence of DVT but did shows a Baker's cyst.  Patient received an injection of ketorolac and was discharged home with NSAIDs and muscle relaxant.  He mentions since receiving his injection, his pain has intensified.  He did take his prescribed medication without relief.  States that he was unable to sleep last night due to the pain.  If sitting and leaning forward, pain did improve, laying flat worsen the pain.    Past Medical History:  Diagnosis Date  . Diabetes mellitus without complication (HCC)   . Gout     There are no active problems to display for this patient.   No past surgical history on file.      Home Medications    Prior to Admission medications   Medication Sig Start Date End Date Taking? Authorizing Provider  allopurinol (ZYLOPRIM) 100 MG tablet Take 100 mg by mouth daily.    [provider]  ibuprofen (ADVIL,MOTRIN) 800 MG tablet Take 1 tablet (800 mg total) by mouth 3 (three) times daily. 06/10/17   Wieters, Hallie C, PA-C  methocarbamol (ROBAXIN) 500 MG tablet Take 1 tablet (500 mg total) by mouth at bedtime as needed  for muscle spasms. 03/12/18   Caccavale, Sophia, PA-C  naproxen (NAPROSYN) 500 MG tablet Take 1 tablet (500 mg total) by mouth 2 (two) times daily with a meal. 03/12/18   Caccavale, Sophia, PA-C    Family History No family history on file.  Social History Social History   Tobacco Use  . Smoking status: Never Smoker  . Smokeless tobacco: Never Used  Substance Use Topics  . Alcohol use: No    Alcohol/week: 0.0 standard drinks  . Drug use: No     Allergies   Aspirin   Review of Systems Review of Systems  Constitutional: Negative for fever.  Musculoskeletal: Positive for back pain.  Skin: Negative for wound.  Neurological: Negative for numbness.     Physical Exam Updated Vital Signs BP (!) 144/105 (BP Location: Right Arm)   Pulse 78   Temp 98 F (36.7 C) (Oral)   Resp 16   SpO2 99%   Physical Exam Vitals signs and nursing note reviewed.  Constitutional:      General: He is not in acute distress.    Appearance: He is well-developed.     Comments: Patient appears mildly uncomfortable.  Sitting and leaning forward.  HENT:     Head: Atraumatic.  Eyes:     Conjunctiva/sclera: Conjunctivae normal.  Neck:     Musculoskeletal: Neck supple.  Musculoskeletal:  General: Tenderness (Tenderness to left lumbar paraspinal muscle on palpation.  Tenderness about the left buttock.  Increasing pain with straight leg raise.  No overlying skin changes.) present.  Skin:    Findings: No rash.  Neurological:     Mental Status: He is alert.     Comments: Patellar deep tendon reflex intact bilaterally.  Able to walk with some difficulty.      ED Treatments / Results  Labs (all labs ordered are listed, but only abnormal results are displayed) Labs Reviewed - No data to display  EKG None  Radiology US Venous Img Lower  Left (dvt Study)  Result Date: 03/12/2018 CLINICAL DATA:  LEFT knee pain and LEFT leg swelling for 3 days, LEFT hip pain for 1 day EXAM: LEFT LOWER  EXTREMITY VENOUS DOPPLER ULTRASOUND TECHNIQUE: Gray-scale sonography with graded compression, as well as color Doppler and duplex ultrasound were performed to evaluate the lower extremity deep venous systems from the level of the common femoral vein and including the common femoral, femoral, profunda femoral, popliteal and calf veins including the posterior tibial, peroneal and gastrocnemius veins when visible. The superficial great saphenous vein was also interrogated. Spectral Doppler was utilized to evaluate flow at rest and with distal augmentation maneuvers in the common femoral, femoral and popliteal veins. COMPARISON:  None FINDINGS: Contralateral Common Femoral Vein: Respiratory phasicity is normal and symmetric with the symptomatic side. No evidence of thrombus. Normal compressibility. Common Femoral Vein: No evidence of thrombus. Normal compressibility, respiratory phasicity and response to augmentation. Saphenofemoral Junction: No evidence of thrombus. Normal compressibility and flow on color Doppler imaging. Profunda Femoral Vein: No evidence of thrombus. Normal compressibility and flow on color Doppler imaging. Femoral Vein: No evidence of thrombus. Normal compressibility, respiratory phasicity and response to augmentation. Popliteal Vein: No evidence of thrombus. Normal compressibility, respiratory phasicity and response to augmentation. Calf Veins: No evidence of thrombus. Normal compressibility and flow on color Doppler imaging. Superficial Great Saphenous Vein: No evidence of thrombus. Normal compressibility. Venous Reflux:  None. Other Findings: Small Baker cyst at LEFT popliteal fossa 4.9 x 1.0 x 3.0 cm. IMPRESSION: No evidence of deep venous thrombosis in the LEFT lower extremity. LEFT Baker's cyst. Electronically Signed   By: Ulyses Southward M.D.   On: 03/12/2018 18:06    Procedures Procedures (including critical care time)  Medications Ordered in ED Medications - No data to  display   Initial Impression / Assessment and Plan / ED Course  I have reviewed the triage vital signs and the nursing notes.  Pertinent labs & imaging results that were available during my care of the patient were reviewed by me and considered in my medical decision making (see chart for details).     BP (!) 144/105 (BP Location: Right Arm)   Pulse 78   Temp 98 F (36.7 C) (Oral)   Resp 16   SpO2 99%    Final Clinical Impressions(s) / ED Diagnoses   Final diagnoses:  Radicular leg pain    ED Discharge Orders         Ordered    traMADol (ULTRAM) 50 MG tablet  Every 6 hours PRN     03/13/18 0748    predniSONE (DELTASONE) 20 MG tablet     03/13/18 0748         7:44 AM Patient with hip and leg pain for the past 2 to 3 days.  Was seen here yesterday for his complaint work-up unremarkable, discharged home with NSAIDs and muscle  relaxant which provide minimal relief.  He is endorsing worsening pain.  No red flags.  Able to ambulate.  He is neurovascular intact.  Will prescribe tramadol and prednisone as there is some radicular component to his pain.  Recommend patient monitor blood sugar carefully.  Orthopedic referral given.  Return precaution discussed.   Fayrene Helperran, Virda Betters, PA-C 03/13/18 [id number]    Derwood KaplanNanavati, Ankit, MD 03/14/18 1133

## 2018-03-13 NOTE — ED Notes (Addendum)
Patient verbalizes understanding of discharge instructions. Opportunity for questioning and answers were provided. VS stable. Pt assisted to ED entrance in wheelchair.

## 2018-03-13 NOTE — ED Triage Notes (Signed)
Pt presents for evaluation of L hip pain. States seen at [facility] yesterday and given medication for pain related to bakers cyst but pain is severe now.

## 2018-10-01 ENCOUNTER — Other Ambulatory Visit: Payer: Self-pay

## 2018-10-01 DIAGNOSIS — Z20822 Contact with and (suspected) exposure to covid-19: Secondary | ICD-10-CM

## 2018-10-03 LAB — NOVEL CORONAVIRUS, NAA: SARS-CoV-2, NAA: DETECTED — AB

## 2018-10-03 LAB — SPECIMEN STATUS REPORT

## 2018-10-06 ENCOUNTER — Other Ambulatory Visit: Payer: Self-pay

## 2018-10-06 ENCOUNTER — Inpatient Hospital Stay (HOSPITAL_COMMUNITY)
Admission: EM | Admit: 2018-10-06 | Discharge: 2018-10-08 | DRG: 177 | Disposition: A | Discharge status: Home / Self Care | Payer: HRSA Program | Attending: Internal Medicine | Admitting: Internal Medicine

## 2018-10-06 ENCOUNTER — Emergency Department (HOSPITAL_COMMUNITY): Payer: HRSA Program

## 2018-10-06 DIAGNOSIS — Z7952 Long term (current) use of systemic steroids: Secondary | ICD-10-CM

## 2018-10-06 DIAGNOSIS — U071 COVID-19: Principal | ICD-10-CM | POA: Diagnosis present

## 2018-10-06 DIAGNOSIS — Z79899 Other long term (current) drug therapy: Secondary | ICD-10-CM

## 2018-10-06 DIAGNOSIS — M109 Gout, unspecified: Secondary | ICD-10-CM | POA: Diagnosis present

## 2018-10-06 DIAGNOSIS — J189 Pneumonia, unspecified organism: Secondary | ICD-10-CM

## 2018-10-06 DIAGNOSIS — Z791 Long term (current) use of non-steroidal anti-inflammatories (NSAID): Secondary | ICD-10-CM

## 2018-10-06 DIAGNOSIS — R079 Chest pain, unspecified: Secondary | ICD-10-CM | POA: Diagnosis not present

## 2018-10-06 DIAGNOSIS — Z6841 Body Mass Index (BMI) 40.0 and over, adult: Secondary | ICD-10-CM

## 2018-10-06 DIAGNOSIS — J1289 Other viral pneumonia: Secondary | ICD-10-CM | POA: Diagnosis present

## 2018-10-06 DIAGNOSIS — Z79891 Long term (current) use of opiate analgesic: Secondary | ICD-10-CM

## 2018-10-06 DIAGNOSIS — E119 Type 2 diabetes mellitus without complications: Secondary | ICD-10-CM

## 2018-10-06 DIAGNOSIS — E669 Obesity, unspecified: Secondary | ICD-10-CM | POA: Diagnosis present

## 2018-10-06 DIAGNOSIS — J1282 Pneumonia due to coronavirus disease 2019: Secondary | ICD-10-CM

## 2018-10-06 DIAGNOSIS — Z823 Family history of stroke: Secondary | ICD-10-CM

## 2018-10-06 LAB — CBC
HCT: 46.1 % (ref 39.0–52.0)
Hemoglobin: 14.8 g/dL (ref 13.0–17.0)
MCH: 26.7 pg (ref 26.0–34.0)
MCHC: 32.1 g/dL (ref 30.0–36.0)
MCV: 83.1 fL (ref 80.0–100.0)
Platelets: 194 10*3/uL (ref 150–400)
RBC: 5.55 MIL/uL (ref 4.22–5.81)
RDW: 13.4 % (ref 11.5–15.5)
WBC: 10.3 10*3/uL (ref 4.0–10.5)
nRBC: 0 % (ref 0.0–0.2)

## 2018-10-06 LAB — BASIC METABOLIC PANEL
Anion gap: 13 (ref 5–15)
BUN: 15 mg/dL (ref 6–20)
CO2: 20 mmol/L — ABNORMAL LOW (ref 22–32)
Calcium: 8.8 mg/dL — ABNORMAL LOW (ref 8.9–10.3)
Chloride: 103 mmol/L (ref 98–111)
Creatinine, Ser: 1.35 mg/dL — ABNORMAL HIGH (ref 0.61–1.24)
GFR calc Af Amer: >60 mL/min (ref >60)
GFR calc non Af Amer: >60 mL/min (ref >60)
Glucose, Bld: 162 mg/dL — ABNORMAL HIGH (ref 70–99)
Potassium: 4.5 mmol/L (ref 3.5–5.1)
Sodium: 136 mmol/L (ref 135–145)

## 2018-10-06 LAB — LACTIC ACID, PLASMA: Lactic Acid, Venous: 1.1 mmol/L (ref 0.5–1.9)

## 2018-10-06 LAB — TROPONIN I (HIGH SENSITIVITY): Troponin I (High Sensitivity): 4 ng/L (ref <18)

## 2018-10-06 MED ORDER — SODIUM CHLORIDE 0.9 % IV SOLN
500.0000 mg | INTRAVENOUS | Status: DC
  Administered 2018-10-07: 500 mg via INTRAVENOUS
  Filled 2018-10-06: qty 500

## 2018-10-06 MED ORDER — SODIUM CHLORIDE 0.9 % IV SOLN
2.0000 g | INTRAVENOUS | Status: DC
  Administered 2018-10-06: 2 g via INTRAVENOUS
  Filled 2018-10-06: qty 20

## 2018-10-06 MED ORDER — SODIUM CHLORIDE 0.9 % IV BOLUS
1000.0000 mL | Freq: Once | INTRAVENOUS | Status: AC
  Administered 2018-10-06: 1000 mL via INTRAVENOUS

## 2018-10-06 MED ORDER — IOHEXOL 350 MG/ML SOLN
75.0000 mL | Freq: Once | INTRAVENOUS | Status: AC | PRN
  Administered 2018-10-06: 75 mL via INTRAVENOUS

## 2018-10-06 MED ORDER — ACETAMINOPHEN 325 MG PO TABS
650.0000 mg | ORAL_TABLET | Freq: Once | ORAL | Status: AC
  Administered 2018-10-06: 650 mg via ORAL
  Filled 2018-10-06: qty 2

## 2018-10-06 NOTE — ED Notes (Signed)
Patient transported to CT 

## 2018-10-06 NOTE — ED Provider Notes (Signed)
[facility] Provider Note   CSN: [id number] Arrival date & time: 10/06/18  2026    History   Chief Complaint Chief Complaint  Patient presents with   Chest Pain    HPI Ricky Glass is a 53 y.o. male with history of diabetes and gout presents today for chest pain and shortness of breath.  Patient diagnosed with COVID-19 virus after being tested on 10/01/2018.  He reports that his symptoms began on 09/27/2018 with loss of smell and taste.  He reports that he was exposed to COVID-19 through a coworker who was positive for the virus but did not self isolate.  Patient reports that over the next week he developed an intermittent cough productive with white sputum, fever/chills, generalized body aches, nausea and decreased appetite.  Reports that symptoms continued he has been treating intermittently with ibuprofen with some relief.  Patient reports that he is feeling somewhat improved yesterday but when he woke up this morning his symptoms had worsened.  Patient reports that his fevers worsen today and he measured a temperature of 103 F at home.  Patient reports that this morning around 8 AM he developed shortness of breath with ambulating to the bathroom as well as a central chest tightness.  He reports that the symptoms are moderate in intensity continued throughout exertion and then quickly improved with rest.  He denies any radiation of the pain.  EMS was called patient was given 324 mg aspirin in route.  Denies headache, vision changes, neck stiffness, vomiting, diarrhea, abdominal pain, extremity swelling/color change, injury, history of blood clot, recent surgery/immobilization, exogenous hormone use or any additional concerns today.     HPI  Past Medical History:  Diagnosis Date   Diabetes mellitus without complication (HCC)    Gout     Patient Active Problem List   Diagnosis Date Noted   Pneumonia due to COVID-19 virus 10/07/2018     No past surgical history on file.      Home Medications    Prior to Admission medications   Medication Sig Start Date End Date Taking? Authorizing Provider  allopurinol (ZYLOPRIM) 100 MG tablet Take 100 mg by mouth daily.    [provider]  ibuprofen (ADVIL,MOTRIN) 800 MG tablet Take 1 tablet (800 mg total) by mouth 3 (three) times daily. 06/10/17   Wieters, Hallie C, PA-C  methocarbamol (ROBAXIN) 500 MG tablet Take 1 tablet (500 mg total) by mouth at bedtime as needed for muscle spasms. 03/12/18   Caccavale, Sophia, PA-C  naproxen (NAPROSYN) 500 MG tablet Take 1 tablet (500 mg total) by mouth 2 (two) times daily with a meal. 03/12/18   Caccavale, Sophia, PA-C  predniSONE (DELTASONE) 20 MG tablet 3 tabs po day one, then 2 tabs daily x 4 days 03/13/18   Fayrene Helperran, Bowie, PA-C  traMADol (ULTRAM) 50 MG tablet Take 1 tablet (50 mg total) by mouth every 6 (six) hours as needed. 03/13/18   Fayrene Helperran, Bowie, PA-C    Family History No family history on file.  Social History Social History   Tobacco Use   Smoking status: Never Smoker   Smokeless tobacco: Never Used  Substance Use Topics   Alcohol use: No    Alcohol/week: 0.0 standard drinks   Drug use: No     Allergies   Aspirin   Review of Systems Review of Systems Ten systems are reviewed and are negative for acute change except as noted in the HPI  Physical Exam Updated  Vital Signs BP (!) 140/111    Pulse 83    Temp 100.1 F (37.8 C) (Oral)    Resp 14    Ht 5\' 9"  (1.753 m)    Wt 128.4 kg    SpO2 99%    BMI 41.79 kg/m   Physical Exam Constitutional:      General: He is not in acute distress.    Appearance: Normal appearance. He is well-developed. He is not ill-appearing or diaphoretic.  HENT:     Head: Normocephalic and atraumatic.     Right Ear: External ear normal.     Left Ear: External ear normal.     Nose: Nose normal.  Eyes:     General: Vision grossly intact. Gaze aligned appropriately.     Pupils: Pupils  are equal, round, and reactive to light.  Neck:     Musculoskeletal: Normal range of motion.     Trachea: Trachea and phonation normal. No tracheal deviation.  Cardiovascular:     Rate and Rhythm: Regular rhythm. Tachycardia present.     Pulses:          Radial pulses are 2+ on the right side and 2+ on the left side.       Dorsalis pedis pulses are 2+ on the right side and 2+ on the left side.     Heart sounds: Normal heart sounds.  Pulmonary:     Effort: Pulmonary effort is normal. No respiratory distress.     Breath sounds: Examination of the right-lower field reveals decreased breath sounds. Examination of the left-lower field reveals decreased breath sounds. Decreased breath sounds present. No rhonchi.  Chest:     Chest wall: No deformity, tenderness or crepitus.  Abdominal:     General: There is no distension.     Palpations: Abdomen is soft.     Tenderness: There is no abdominal tenderness. There is no guarding or rebound.  Musculoskeletal: Normal range of motion.     Right lower leg: He exhibits no tenderness. No edema.     Left lower leg: He exhibits no tenderness. No edema.  Skin:    General: Skin is warm and dry.     Capillary Refill: Capillary refill takes less than 2 seconds.  Neurological:     Mental Status: He is alert.     GCS: GCS eye subscore is 4. GCS verbal subscore is 5. GCS motor subscore is 6.     Comments: Speech is clear and goal oriented, follows commands Major Cranial nerves without deficit, no facial droop Moves extremities without ataxia, coordination intact  Psychiatric:        Behavior: Behavior normal.    ED Treatments / Results  Labs (all labs ordered are listed, but only abnormal results are displayed) Labs Reviewed  BASIC METABOLIC PANEL - Abnormal; Notable for the following components:      Result Value   CO2 20 (*)    Glucose, Bld 162 (*)    Creatinine, Ser 1.35 (*)    Calcium 8.8 (*)    All other components within normal limits   URINALYSIS, ROUTINE W REFLEX MICROSCOPIC - Abnormal; Notable for the following components:   Specific Gravity, Urine 1.042 (*)    Ketones, ur 5 (*)    All other components within normal limits  CULTURE, BLOOD (ROUTINE X 2)  CULTURE, BLOOD (ROUTINE X 2)  URINE CULTURE  CBC  LACTIC ACID, PLASMA  LACTIC ACID, PLASMA  TROPONIN I (HIGH SENSITIVITY)  TROPONIN I (HIGH  SENSITIVITY)    EKG EKG Interpretation  Date/Time:  Tuesday October 06 2018 20:33:08 EDT Ventricular Rate:  104 PR Interval:    QRS Duration: 69 QT Interval:  324 QTC Calculation: 427 R Axis:   54 Text Interpretation:  Sinus tachycardia Probable left atrial enlargement Borderline repolarization abnormality No significant change since last tracing Confirmed by Varney Biles [phone]) on 10/07/2018 12:25:20 AM   Radiology Ct Angio Chest Pe W And/or Wo Contrast  Result Date: 10/06/2018 CLINICAL DATA:  Shortness of breath, chest pain, COVID positive EXAM: CT ANGIOGRAPHY CHEST WITH CONTRAST TECHNIQUE: Multidetector CT imaging of the chest was performed using the standard protocol during bolus administration of intravenous contrast. Multiplanar CT image reconstructions and MIPs were obtained to evaluate the vascular anatomy. CONTRAST:  66mL OMNIPAQUE IOHEXOL 350 MG/ML SOLN COMPARISON:  None. FINDINGS: Cardiovascular: Suboptimal evaluation of the pulmonary arteries due to preferential opacification of the aorta at the time of imaging. Within that constraint, there is no evidence of central pulmonary embolism to the lobar level. No evidence of thoracic aortic aneurysm or dissection. Heart is normal in size.  No pericardial effusion. Mediastinum/Nodes: No suspicious mediastinal lymphadenopathy. Visualized thyroid is unremarkable. Lungs/Pleura: Multifocal patchy/ground-glass opacities in all lobes, with a peripheral/subpleural distribution, compatible with the clinical history of COVID-19. Trace bilateral pleural effusions. No suspicious  pulmonary nodules. No pneumothorax. Upper Abdomen: Visualized upper abdomen is unremarkable. Musculoskeletal: Visualized osseous structures are within normal limits. Review of the MIP images confirms the above findings. IMPRESSION: Limited evaluation.  No evidence of pulmonary embolism. Multifocal pneumonia in this patient with clinical history of COVID-19. Trace bilateral pleural effusions. Electronically Signed   By: Julian Hy M.D.   On: 10/06/2018 23:27   Dg Chest Portable 1 View  Result Date: 10/06/2018 CLINICAL DATA:  Known COVID-19 positivity with chest pain and shortness of breath EXAM: PORTABLE CHEST 1 VIEW COMPARISON:  02/22/12 FINDINGS: Cardiac Ricky is within normal limits. Patchy bibasilar infiltrates are seen consistent with the patient's given clinical history. No sizable effusion is noted. No bony abnormality is noted. IMPRESSION: Patchy bibasilar infiltrates consistent with the given clinical history. Electronically Signed   By: Inez Catalina M.D.   On: 10/06/2018 21:22    Procedures Procedures (including critical care time)  Medications Ordered in ED Medications  cefTRIAXone (ROCEPHIN) 2 g in sodium chloride 0.9 % 100 mL IVPB (0 g Intravenous Stopped 10/07/18 0002)  azithromycin (ZITHROMAX) 500 mg in sodium chloride 0.9 % 250 mL IVPB (500 mg Intravenous New Bag/Given 10/07/18 0023)  sodium chloride 0.9 % bolus 1,000 mL (1,000 mLs Intravenous New Bag/Given 10/06/18 2224)  acetaminophen (TYLENOL) tablet 650 mg (650 mg Oral Given 10/06/18 2224)  iohexol (OMNIPAQUE) 350 MG/ML injection 75 mL (75 mLs Intravenous Contrast Given 10/06/18 2254)     Initial Impression / Assessment and Plan / ED Course  I have reviewed the triage vital signs and the nursing notes.  Pertinent labs & imaging results that were available during my care of the patient were reviewed by me and considered in my medical decision making (see chart for details).  Clinical Course as of Oct 07 23  Wed Oct 07, 2018  0018 Dr. Marlowe Sax   [BM]    Clinical Course User Index [BM] Deliah Boston, PA-C   Patient arrives with chest pain and shortness of breath, tachycardic and tired appearing.  Will obtain chest pain work-up in addition to CT PE study, likely patient with COVID-19 virus as etiology of symptoms however will need  to rule out PE at this time, discussed with Dr. Clarice PolePfeifer who agrees with plan of care. - Lactic within normal limits Troponin: 4 in the setting of greater than 12 hours of chest pain would not need repeat Urinalysis negative BMP nonacute CBC within normal limit EKG: Sinus tachycardia Probable left atrial enlargement Borderline repolarization abnormality No significant change since last tracing Confirmed by Derwood KaplanNanavati, Ankit [phone] on 10/07/2018 12:25:20 AM  DG chest:  IMPRESSION:  Patchy bibasilar infiltrates consistent with the given clinical  history.   CT PE Study:  IMPRESSION:  Limited evaluation. No evidence of pulmonary embolism.    Multifocal pneumonia in this patient with clinical history of  COVID-19. Trace bilateral pleural effusions.  - IV fluid bolus given, Tylenol given, azithromycin and Rocephin begun.  Patient reports he is feeling improved after IV fluids and Tylenol, tachycardia has resolved.  He states understanding of need for admission for his multifocal pneumonia in the setting of his COVID-19 infection he is agreeable to admission at this time.  Discussed case with Dr. Loney Lohathore will be seeing patient for admission. - Patient has been admitted to hospital service for further evaluation and treatment.  Ricky Glass was evaluated in Emergency Department on 10/07/2018 for the symptoms described in the history of present illness. He was evaluated in the context of the global COVID-19 pandemic, which necessitated consideration that the patient might be at risk for infection with the SARS-CoV-2 virus that causes COVID-19. Institutional protocols and  algorithms that pertain to the evaluation of patients at risk for COVID-19 are in a state of rapid change based on information released by regulatory bodies including the CDC and federal and state organizations. These policies and algorithms were followed during the patient's care in the ED.   Note: Portions of this report may have been transcribed using voice recognition software. Every effort was made to ensure accuracy; however, inadvertent computerized transcription errors may still be present. Final Clinical Impressions(s) / ED Diagnoses   Final diagnoses:  COVID-19 virus infection  Multifocal pneumonia    ED Discharge Orders    None       Elizabeth PalauMorelli, Leonia Heatherly A, PA-C 10/07/18 0026    Arby BarrettePfeiffer, Marcy, MD 10/18/18 1555

## 2018-10-06 NOTE — ED Triage Notes (Signed)
Pt bib ems from home, confirmed covid+ on the 8th. CP with dyspnea with exertion onset today. On scene pt diaphoretic and cool to the touch. Endorses nausea. 8/10 central cp. Given asa en route. 99% RA, 162/116, 122 HR, CBG 155. 18g LAC.

## 2018-10-07 ENCOUNTER — Encounter (HOSPITAL_COMMUNITY): Payer: Self-pay | Admitting: Internal Medicine

## 2018-10-07 DIAGNOSIS — U071 COVID-19: Principal | ICD-10-CM

## 2018-10-07 DIAGNOSIS — E119 Type 2 diabetes mellitus without complications: Secondary | ICD-10-CM | POA: Diagnosis present

## 2018-10-07 DIAGNOSIS — R079 Chest pain, unspecified: Secondary | ICD-10-CM | POA: Diagnosis present

## 2018-10-07 DIAGNOSIS — M109 Gout, unspecified: Secondary | ICD-10-CM | POA: Diagnosis present

## 2018-10-07 DIAGNOSIS — Z79899 Other long term (current) drug therapy: Secondary | ICD-10-CM | POA: Diagnosis not present

## 2018-10-07 DIAGNOSIS — Z823 Family history of stroke: Secondary | ICD-10-CM | POA: Diagnosis not present

## 2018-10-07 DIAGNOSIS — J1282 Pneumonia due to coronavirus disease 2019: Secondary | ICD-10-CM | POA: Diagnosis present

## 2018-10-07 DIAGNOSIS — J1289 Other viral pneumonia: Secondary | ICD-10-CM | POA: Diagnosis not present

## 2018-10-07 DIAGNOSIS — Z791 Long term (current) use of non-steroidal anti-inflammatories (NSAID): Secondary | ICD-10-CM | POA: Diagnosis not present

## 2018-10-07 DIAGNOSIS — Z79891 Long term (current) use of opiate analgesic: Secondary | ICD-10-CM | POA: Diagnosis not present

## 2018-10-07 DIAGNOSIS — Z7952 Long term (current) use of systemic steroids: Secondary | ICD-10-CM | POA: Diagnosis not present

## 2018-10-07 DIAGNOSIS — Z6841 Body Mass Index (BMI) 40.0 and over, adult: Secondary | ICD-10-CM | POA: Diagnosis not present

## 2018-10-07 DIAGNOSIS — E669 Obesity, unspecified: Secondary | ICD-10-CM | POA: Diagnosis present

## 2018-10-07 LAB — CBC WITH DIFFERENTIAL/PLATELET
Abs Immature Granulocytes: 0.07 10*3/uL (ref 0.00–0.07)
Basophils Absolute: 0 10*3/uL (ref 0.0–0.1)
Basophils Relative: 0 %
Eosinophils Absolute: 0 10*3/uL (ref 0.0–0.5)
Eosinophils Relative: 0 %
HCT: 43.2 % (ref 39.0–52.0)
Hemoglobin: 13.3 g/dL (ref 13.0–17.0)
Immature Granulocytes: 1 %
Lymphocytes Relative: 15 %
Lymphs Abs: 1.4 10*3/uL (ref 0.7–4.0)
MCH: 25.6 pg — ABNORMAL LOW (ref 26.0–34.0)
MCHC: 30.8 g/dL (ref 30.0–36.0)
MCV: 83.2 fL (ref 80.0–100.0)
Monocytes Absolute: 0.5 10*3/uL (ref 0.1–1.0)
Monocytes Relative: 5 %
Neutro Abs: 7.3 10*3/uL (ref 1.7–7.7)
Neutrophils Relative %: 79 %
Platelets: 203 10*3/uL (ref 150–400)
RBC: 5.19 MIL/uL (ref 4.22–5.81)
RDW: 13.6 % (ref 11.5–15.5)
WBC: 9.2 10*3/uL (ref 4.0–10.5)
nRBC: 0 % (ref 0.0–0.2)

## 2018-10-07 LAB — LACTATE DEHYDROGENASE
LDH: 171 U/L (ref 98–192)
LDH: 209 U/L — ABNORMAL HIGH (ref 98–192)

## 2018-10-07 LAB — URINALYSIS, ROUTINE W REFLEX MICROSCOPIC
Bilirubin Urine: NEGATIVE
Glucose, UA: NEGATIVE mg/dL
Hgb urine dipstick: NEGATIVE
Ketones, ur: 5 mg/dL — AB
Leukocytes,Ua: NEGATIVE
Nitrite: NEGATIVE
Protein, ur: NEGATIVE mg/dL
Specific Gravity, Urine: 1.042 — ABNORMAL HIGH (ref 1.005–1.030)
pH: 6 (ref 5.0–8.0)

## 2018-10-07 LAB — COMPREHENSIVE METABOLIC PANEL
ALT: 26 U/L (ref 0–44)
AST: 22 U/L (ref 15–41)
Albumin: 3.2 g/dL — ABNORMAL LOW (ref 3.5–5.0)
Alkaline Phosphatase: 50 U/L (ref 38–126)
Anion gap: 8 (ref 5–15)
BUN: 19 mg/dL (ref 6–20)
CO2: 24 mmol/L (ref 22–32)
Calcium: 8.2 mg/dL — ABNORMAL LOW (ref 8.9–10.3)
Chloride: 105 mmol/L (ref 98–111)
Creatinine, Ser: 1.35 mg/dL — ABNORMAL HIGH (ref 0.61–1.24)
GFR calc Af Amer: >60 mL/min (ref >60)
GFR calc non Af Amer: >60 mL/min (ref >60)
Glucose, Bld: 194 mg/dL — ABNORMAL HIGH (ref 70–99)
Potassium: 4.9 mmol/L (ref 3.5–5.1)
Sodium: 137 mmol/L (ref 135–145)
Total Bilirubin: 0.4 mg/dL (ref 0.3–1.2)
Total Protein: 6.9 g/dL (ref 6.5–8.1)

## 2018-10-07 LAB — LACTIC ACID, PLASMA: Lactic Acid, Venous: 1.2 mmol/L (ref 0.5–1.9)

## 2018-10-07 LAB — GLUCOSE, CAPILLARY
Glucose-Capillary: 166 mg/dL — ABNORMAL HIGH (ref 70–99)
Glucose-Capillary: 274 mg/dL — ABNORMAL HIGH (ref 70–99)
Glucose-Capillary: 189 mg/dL — ABNORMAL HIGH (ref 70–99)
Glucose-Capillary: 186 mg/dL — ABNORMAL HIGH (ref 70–99)

## 2018-10-07 LAB — FERRITIN: Ferritin: 192 ng/mL (ref 24–336)

## 2018-10-07 LAB — PHOSPHORUS: Phosphorus: 3.4 mg/dL (ref 2.5–4.6)

## 2018-10-07 LAB — MAGNESIUM: Magnesium: 2.1 mg/dL (ref 1.7–2.4)

## 2018-10-07 LAB — HIV ANTIBODY (ROUTINE TESTING W REFLEX): HIV Screen 4th Generation wRfx: NONREACTIVE

## 2018-10-07 LAB — C-REACTIVE PROTEIN: CRP: 11.3 mg/dL — ABNORMAL HIGH (ref <1.0)

## 2018-10-07 LAB — CBG MONITORING, ED: Glucose-Capillary: 165 mg/dL — ABNORMAL HIGH (ref 70–99)

## 2018-10-07 LAB — PROCALCITONIN: Procalcitonin: <0.1 ng/mL

## 2018-10-07 LAB — TRIGLYCERIDES: Triglycerides: 61 mg/dL (ref <150)

## 2018-10-07 LAB — FIBRINOGEN: Fibrinogen: 679 mg/dL — ABNORMAL HIGH (ref 210–475)

## 2018-10-07 LAB — HEMOGLOBIN A1C
Hgb A1c MFr Bld: 8.4 % — ABNORMAL HIGH (ref 4.8–5.6)
Mean Plasma Glucose: 194.38 mg/dL

## 2018-10-07 LAB — TROPONIN I (HIGH SENSITIVITY): Troponin I (High Sensitivity): 3 ng/L (ref <18)

## 2018-10-07 LAB — D-DIMER, QUANTITATIVE: D-Dimer, Quant: <0.27 ug/mL-FEU (ref 0.00–0.50)

## 2018-10-07 MED ORDER — ENOXAPARIN SODIUM 60 MG/0.6ML ~~LOC~~ SOLN
60.0000 mg | SUBCUTANEOUS | Status: DC
  Administered 2018-10-07 – 2018-10-08 (×2): 60 mg via SUBCUTANEOUS
  Filled 2018-10-07 (×2): qty 0.6

## 2018-10-07 MED ORDER — GUAIFENESIN-DM 100-10 MG/5ML PO SYRP: 10.0000 mL | ORAL_SOLUTION | ORAL | Status: DC | PRN

## 2018-10-07 MED ORDER — DEXAMETHASONE SODIUM PHOSPHATE 10 MG/ML IJ SOLN
6.0000 mg | INTRAMUSCULAR | Status: DC
  Administered 2018-10-08: 09:00:00 6 mg via INTRAVENOUS
  Filled 2018-10-07: qty 1

## 2018-10-07 MED ORDER — ACETAMINOPHEN 325 MG PO TABS: 650.0000 mg | ORAL_TABLET | Freq: Four times a day (QID) | ORAL | Status: DC | PRN

## 2018-10-07 MED ORDER — INSULIN ASPART 100 UNIT/ML ~~LOC~~ SOLN
0.0000 [IU] | Freq: Three times a day (TID) | SUBCUTANEOUS | Status: DC
  Administered 2018-10-07 (×2): 2 [IU] via SUBCUTANEOUS
  Administered 2018-10-07: 5 [IU] via SUBCUTANEOUS
  Administered 2018-10-08: 09:00:00 2 [IU] via SUBCUTANEOUS

## 2018-10-07 MED ORDER — HYDROCOD POLST-CPM POLST ER 10-8 MG/5ML PO SUER: 5.0000 mL | Freq: Two times a day (BID) | ORAL | Status: DC | PRN

## 2018-10-07 MED ORDER — METHYLPREDNISOLONE SODIUM SUCC 125 MG IJ SOLR
60.0000 mg | Freq: Two times a day (BID) | INTRAMUSCULAR | Status: AC
  Administered 2018-10-07 – 2018-10-08 (×3): 60 mg via INTRAVENOUS
  Filled 2018-10-07 (×3): qty 2

## 2018-10-07 MED ORDER — INSULIN ASPART 100 UNIT/ML ~~LOC~~ SOLN: 0.0000 [IU] | Freq: Every day | SUBCUTANEOUS | Status: DC

## 2018-10-07 MED ORDER — ENSURE MAX PROTEIN PO LIQD
11.0000 [oz_av] | Freq: Every day | ORAL | Status: DC
  Administered 2018-10-07: 11 [oz_av] via ORAL
  Administered 2018-10-08: 237 mL via ORAL
  Filled 2018-10-07 (×3): qty 330

## 2018-10-07 NOTE — ED Notes (Signed)
Called [facility] for transport to [facility] 

## 2018-10-07 NOTE — ED Notes (Signed)
ED TO INPATIENT HANDOFF REPORT  ED Nurse Name and Phone #:  [phone]  S Name/Age/Gender Ricky Glass 53 y.o. male Room/Bed: 026C/026C  Code Status   Code Status: Full Code  Home/SNF/Other Home Patient oriented to: self, place, time and situation Is this baseline? Yes   Triage Complete: Triage complete  Chief Complaint chest pain, cvoid+  Triage Note Pt bib ems from home, confirmed covid+ on the 8th. CP with dyspnea with exertion onset today. On scene pt diaphoretic and cool to the touch. Endorses nausea. 8/10 central cp. Given asa en route. 99% RA, 162/116, 122 HR, CBG 155. 18g LAC.    Allergies Allergies  Allergen Reactions  . Aspirin     Childhood reaction    Level of Care/Admitting Diagnosis ED Disposition    ED Disposition Condition Comment   Admit  [facility] [[id number]]  Level of Care: Telemetry [5]  Covid Evaluation: Confirmed COVID Positive  Diagnosis: Pneumonia due to COVID-19 virus [[id number]][[id number]]  Admitting Physician: John GiovanniATHORE, VASUNDHRA [[id number]]  Attending Physician: John GiovanniATHORE, VASUNDHRA [[id number]]  Estimated length of stay: past midnight tomorrow  Certification:: I certify this patient will need inpatient services for at least 2 midnights  PT Class (Do Not Modify): Inpatient [101]  PT Acc Code (Do Not Modify): Private [1]       B Medical/Surgery History Past Medical History:  Diagnosis Date  . Diabetes mellitus without complication (HCC)   . Gout    History reviewed. No pertinent surgical history.   A IV Location/Drains/Wounds Patient Lines/Drains/Airways Status   Active Line/Drains/Airways    Name:   Placement date:   Placement time:   Site:   Days:   Peripheral IV 10/06/18 Left Antecubital   10/06/18    2128    Antecubital   1          Intake/Output Last 24 hours No intake or output data in the 24 hours ending 10/07/18 0050  Labs/Imaging Results for orders placed or performed during the hospital  encounter of 10/06/18 (from the past 48 hour(s))  Troponin I (High Sensitivity)     Status: None   Collection Time: 10/06/18 12:13 AM  Result Value Ref Range   Troponin I (High Sensitivity) 3 <18 ng/L    Comment: (NOTE) Elevated high sensitivity troponin I (hsTnI) values and significant  changes across serial measurements may suggest ACS but many other  chronic and acute conditions are known to elevate hsTnI results.  Refer to the "Links" section for chest pain algorithms and additional  guidance. Performed at [facility], [address] [place], [place] [address]   Basic metabolic panel     Status: Abnormal   Collection Time: 10/06/18  8:43 PM  Result Value Ref Range   Sodium 136 135 - 145 mmol/L   Potassium 4.5 3.5 - 5.1 mmol/L   Chloride 103 98 - 111 mmol/L   CO2 20 (L) 22 - 32 mmol/L   Glucose, Bld 162 (H) 70 - 99 mg/dL   BUN 15 6 - 20 mg/dL   Creatinine, Ser 5.281.35 (H) 0.61 - 1.24 mg/dL   Calcium 8.8 (L) 8.9 - 10.3 mg/dL   GFR calc non Af Amer >60 >60 mL/min   GFR calc Af Amer >60 >60 mL/min   Anion gap 13 5 - 15    Comment: Performed at [facility], [address] [place], [place] [address]  CBC     Status: None   Collection  Time: 10/06/18  8:43 PM  Result Value Ref Range   WBC 10.3 4.0 - 10.5 K/uL   RBC 5.55 4.22 - 5.81 MIL/uL   Hemoglobin 14.8 13.0 - 17.0 g/dL   HCT 40.946.1 81.139.0 - 91.452.0 %   MCV 83.1 80.0 - 100.0 fL   MCH 26.7 26.0 - 34.0 pg   MCHC 32.1 30.0 - 36.0 g/dL   RDW 78.213.4 95.611.5 - 21.315.5 %   Platelets 194 150 - 400 K/uL   nRBC 0.0 0.0 - 0.2 %    Comment: Performed at [facility], [address] [place], [place] [address]  Troponin I (High Sensitivity)     Status: None   Collection Time: 10/06/18  8:43 PM  Result Value Ref Range   Troponin I (High Sensitivity) 4 <18 ng/L    Comment: (NOTE) Elevated high sensitivity troponin I (hsTnI) values and significant  changes across serial measurements may suggest ACS but many other  chronic and  acute conditions are known to elevate hsTnI results.  Refer to the "Links" section for chest pain algorithms and additional  guidance. Performed at [facility], [address] [place], [place] [address]   Lactic acid, plasma     Status: None   Collection Time: 10/06/18 10:29 PM  Result Value Ref Range   Lactic Acid, Venous 1.1 0.5 - 1.9 mmol/L    Comment: Performed at [facility], [address] [place], [place] [address]  Urinalysis, Routine w reflex microscopic     Status: Abnormal   Collection Time: 10/07/18 12:02 AM  Result Value Ref Range   Color, Urine YELLOW YELLOW   APPearance CLEAR CLEAR   Specific Gravity, Urine 1.042 (H) 1.005 - 1.030   pH 6.0 5.0 - 8.0   Glucose, UA NEGATIVE NEGATIVE mg/dL   Hgb urine dipstick NEGATIVE NEGATIVE   Bilirubin Urine NEGATIVE NEGATIVE   Ketones, ur 5 (A) NEGATIVE mg/dL   Protein, ur NEGATIVE NEGATIVE mg/dL   Nitrite NEGATIVE NEGATIVE   Leukocytes,Ua NEGATIVE NEGATIVE    Comment: Performed at [facility], [address] [place], [place] [address]  Lactic acid, plasma     Status: None   Collection Time: 10/07/18 12:15 AM  Result Value Ref Range   Lactic Acid, Venous 1.2 0.5 - 1.9 mmol/L    Comment: Performed at [facility], [address] [place], [place] [address]   Ct Angio Chest Pe W And/or Wo Contrast  Result Date: 10/06/2018 CLINICAL DATA:  Shortness of breath, chest pain, COVID positive EXAM: CT ANGIOGRAPHY CHEST WITH CONTRAST TECHNIQUE: Multidetector CT imaging of the chest was performed using the standard protocol during bolus administration of intravenous contrast. Multiplanar CT image reconstructions and MIPs were obtained to evaluate the vascular anatomy. CONTRAST:  75mL OMNIPAQUE IOHEXOL 350 MG/ML SOLN COMPARISON:  None. FINDINGS: Cardiovascular: Suboptimal evaluation of the pulmonary arteries due to preferential opacification of the aorta at the time of imaging. Within that constraint, there is no  evidence of central pulmonary embolism to the lobar level. No evidence of thoracic aortic aneurysm or dissection. Heart is normal in size.  No pericardial effusion. Mediastinum/Nodes: No suspicious mediastinal lymphadenopathy. Visualized thyroid is unremarkable. Lungs/Pleura: Multifocal patchy/ground-glass opacities in all lobes, with a peripheral/subpleural distribution, compatible with the clinical history of COVID-19. Trace bilateral pleural effusions. No suspicious pulmonary nodules. No pneumothorax. Upper Abdomen: Visualized upper abdomen is unremarkable. Musculoskeletal: Visualized osseous structures are within normal limits. Review of the MIP images  confirms the above findings. IMPRESSION: Limited evaluation.  No evidence of pulmonary embolism. Multifocal pneumonia in this patient with clinical history of COVID-19. Trace bilateral pleural effusions. Electronically Signed   By: Julian Hy M.D.   On: 10/06/2018 23:27   Dg Chest Portable 1 View  Result Date: 10/06/2018 CLINICAL DATA:  Known COVID-19 positivity with chest pain and shortness of breath EXAM: PORTABLE CHEST 1 VIEW COMPARISON:  02/22/12 FINDINGS: Cardiac shadow is within normal limits. Patchy bibasilar infiltrates are seen consistent with the patient's given clinical history. No sizable effusion is noted. No bony abnormality is noted. IMPRESSION: Patchy bibasilar infiltrates consistent with the given clinical history. Electronically Signed   By: Inez Catalina M.D.   On: 10/06/2018 21:22    Pending Labs Unresulted Labs (From admission, onward)    Start     Ordered   10/08/18 0500  CBC with Differential/Platelet  Daily,   R     10/07/18 0041   10/08/18 0500  Comprehensive metabolic panel  Daily,   R     10/07/18 0041   10/08/18 0500  D-dimer, quantitative (not at [facility])  Daily,   R     10/07/18 0041   10/08/18 0500  Ferritin  Daily,   R     10/07/18 0041   10/07/18 0500  Hemoglobin A1c  Tomorrow morning,   R    Comments: To  assess prior glycemic control    10/07/18 0041   10/07/18 0500  Lactate dehydrogenase  Daily,   R     10/07/18 0041   10/07/18 0039  D-dimer, quantitative (not at [facility])  Once,   STAT     10/07/18 0041   10/07/18 0039  Ferritin  Once,   STAT     10/07/18 0041   10/07/18 0039  Fibrinogen  Once,   STAT     10/07/18 0041   10/07/18 0039  Lactate dehydrogenase  Once,   STAT     10/07/18 0041   10/07/18 0039  Procalcitonin  Once,   STAT     10/07/18 0041   10/07/18 0039  Triglycerides  Once,   STAT     10/07/18 0041   10/07/18 0038  C-reactive protein  Once,   STAT     10/07/18 0041   10/07/18 0036  HIV antibody (Routine Testing)  Once,   STAT     10/07/18 0041   10/06/18 2141  Blood Culture (routine x 2)  BLOOD CULTURE X 2,   STAT     10/06/18 2141   10/06/18 2141  Urine culture  ONCE - STAT,   STAT     10/06/18 2141          Vitals/Pain Today's Vitals   10/06/18 2300 10/06/18 2355 10/07/18 0000 10/07/18 0003  BP: (!) 142/102  (!) 140/111   Pulse: 93  83   Resp: 15  14   Temp:      TempSrc:      SpO2: 99%  99%   Weight:  128.4 kg    Height:  5\' 9"  (1.753 m)    PainSc:    0-No pain    Isolation Precautions Airborne and Contact precautions  Medications Medications  insulin aspart (novoLOG) injection 0-9 Units (has no administration in time range)  insulin aspart (novoLOG) injection 0-5 Units (has no administration in time range)  enoxaparin (LOVENOX) injection 40 mg (has no administration in time range)  guaiFENesin-dextromethorphan (ROBITUSSIN DM) 100-10 MG/5ML syrup 10 mL (has no  administration in time range)  chlorpheniramine-HYDROcodone (TUSSIONEX) 10-8 MG/5ML suspension 5 mL (has no administration in time range)  acetaminophen (TYLENOL) tablet 650 mg (has no administration in time range)  methylPREDNISolone sodium succinate (SOLU-MEDROL) 125 mg/2 mL injection 60 mg (has no administration in time range)  sodium chloride 0.9 % bolus 1,000 mL (1,000 mLs Intravenous  New Bag/Given 10/06/18 2224)  acetaminophen (TYLENOL) tablet 650 mg (650 mg Oral Given 10/06/18 2224)  iohexol (OMNIPAQUE) 350 MG/ML injection 75 mL (75 mLs Intravenous Contrast Given 10/06/18 2254)    Mobility walks Low fall risk   Focused Assessments Pulmonary Assessment Handoff:  Lung sounds:   O2 Device: Room Air        R Recommendations: See Admitting Provider Note  Report given to:   Additional Notes: -

## 2018-10-07 NOTE — Progress Notes (Addendum)
[place] TEAM 1 - Stepdown/ICU TEAM  Stacie R [place]ique  EPP:[id number] DOB: 01-18-1966 DOA: 10/06/2018 PCP: System, Pcp Not In    Brief Narrative:  53 year old with a history of DM 2 and gout who presented to the ED via EMS with complaints of shortness of breath and chest pain.  The patient was found to be COVID positive on August 6 after he lost his sense of smell and taste.  Since that time he reported progressive body aches, worsening shortness of breath, substernal chest discomfort, and a temperature as high as 104.  In the ED the patient was not tachypneic nor hypoxic.  He had a negative troponin and an EKG was without acute changes.  A chest x-ray confirmed patchy bibasilar infiltrates and a CT angiogram of the chest was negative for PE.  Significant Events: 8/6 COVID test positive 8/12 admit via [facility]  COVID-19 specific Treatment: Solumedrol 8/12 >  Subjective: Seen for a follow-up exam.  Assessment & Plan:  COVID Pneumonia  Recent Labs    10/07/18 0058 10/07/18 0425  DDIMER <0.27  --   FERRITIN 192  --   LDH 171 209*  CRP 11.3*  --     Chest pain Troponin negative x2 with nonacute EKG - pain resolved   DM 2 A1c 8.6  Obesity - Estimated body mass index is 41.79 kg/m as calculated from the following:   Height as of this encounter: 5\' 9"  (1.753 m).   Weight as of this encounter: 128.4 kg.   DVT prophylaxis: Lovenox Code Status: FULL CODE Family Communication:  Disposition Plan:   Consultants:  none  Antimicrobials:  None  Objective: Blood pressure (!) 134/99, pulse 84, temperature 97.6 F (36.4 C), temperature source Oral, resp. rate (!) 25, height 5\' 9"  (1.753 m), weight 128.4 kg, SpO2 100 %. No intake or output data in the 24 hours ending 10/07/18 0759 Filed Weights   10/06/18 2355  Weight: 128.4 kg    Examination: Patient was seen for a follow-up exam  CBC: Recent Labs  Lab 10/06/18 2043 10/07/18 0425  WBC 10.3 9.2  NEUTROABS   --  7.3  HGB 14.8 13.3  HCT 46.1 43.2  MCV 83.1 83.2  PLT 194 606   Basic Metabolic Panel: Recent Labs  Lab 10/06/18 2043 10/07/18 0425  NA 136 137  K 4.5 4.9  CL 103 105  CO2 20* 24  GLUCOSE 162* 194*  BUN 15 19  CREATININE 1.35* 1.35*  CALCIUM 8.8* 8.2*  MG  --  2.1  PHOS  --  3.4   GFR: Estimated Creatinine Clearance: 86.9 mL/min (A) (by C-G formula based on SCr of 1.35 mg/dL (H)).  Liver Function Tests: Recent Labs  Lab 10/07/18 0425  AST 22  ALT 26  ALKPHOS 50  BILITOT 0.4  PROT 6.9  ALBUMIN 3.2*    HbA1C: Hemoglobin A1C  Date/Time Value Ref Range Status  12/09/2016 11:49 AM 8.6  Final    CBG: Recent Labs  Lab 10/07/18 0130  GLUCAP 165*    Recent Results (from the past 240 hour(s))  Novel Coronavirus, NAA ([facility])     Status: Abnormal   Collection Time: 10/01/18 12:00 AM   Specimen: Oropharyngeal(OP) collection in vial transport medium   OROPHARYNGEA  TESTING  Result Value Ref Range Status   SARS-CoV-2, NAA Detected (A) Not Detected Final    Comment: This test was developed and its performance characteristics determined by [employer]. This test has not been FDA  cleared or approved. This test has been authorized by FDA under an Emergency Use Authorization (EUA). This test is only authorized for the duration of time the declaration that circumstances exist justifying the authorization of the emergency use of in vitro diagnostic tests for detection of SARS-CoV-2 virus and/or diagnosis of COVID-19 infection under section 564(b)(1) of the Act, 21 U.S.C. 161WRU-0(A)(5360bbb-3(b)(1), unless the authorization is terminated or revoked sooner. When diagnostic testing is negative, the possibility of a false negative result should be considered in the context of a patient's recent exposures and the presence of clinical signs and symptoms consistent with COVID-19. An individual without symptoms of COVID-19 and who is not shedding SARS-CoV-2 virus would  expect to have a negative (not detected) result in this assay.      Scheduled Meds: . enoxaparin (LOVENOX) injection  60 mg Subcutaneous Q24H  . insulin aspart  0-5 Units Subcutaneous QHS  . insulin aspart  0-9 Units Subcutaneous TID WC  . methylPREDNISolone (SOLU-MEDROL) injection  60 mg Intravenous Q12H     LOS: 0 days   Lonia BloodJeffrey T. , MD [facility] Office  [phone] Pager - Text Page per Amion  If 7PM-7AM, please contact night-coverage per Amion 10/07/2018, 7:59 AM

## 2018-10-07 NOTE — Progress Notes (Addendum)
Pt has been on phone with family throughout the day updating. Pt declines update of family at this time.

## 2018-10-07 NOTE — Progress Notes (Signed)
Pt arrived from [facility] via [place], Ambulatory from stretcher to bed. Pt stating he feels much better than he did hours ago. Pt is on roomair, oriented to the room ^ POC, placed on tele & cont pulse ox. IS & Flutter valve explained to pt with proper return demonstrations.. Pt denied need for me to call his mother / daughter as he corresponded via text with them. Explained to NO visitor policy in place here.

## 2018-10-07 NOTE — Progress Notes (Signed)
Initial Nutrition Assessment  DOCUMENTATION CODES:   Obesity unspecified  INTERVENTION:    Ensure Max po once daily, each supplement provides 150 kcal and 30 grams of protein.   NUTRITION DIAGNOSIS:   Increased nutrient needs related to acute illness(COVID) as evidenced by estimated needs.  GOAL:   Patient will meet greater than or equal to 90% of their needs  MONITOR:   PO intake, Supplement acceptance  REASON FOR ASSESSMENT:   Malnutrition Screening Tool    ASSESSMENT:   53 yo male admitted with chest pain, dyspnea. Confirmed COVID+ on 8/8. PMH includes DM, gout.   Unable to speak with patient, no answer when phone number called.   Per malnutrition screening tool, patient reported weight loss and poor appetite/intake PTA. This is not reflected by weight encounters. 94.8 kg in January, 128.368 kg this admission. Unsure accuracy of weights.    Meal completion documented at 75% of breakfast today.   Labs reviewed. A1C 8.4 (H) CBG's: 165-166  Medications reviewed and include novolog, solu-medrol.  NUTRITION - FOCUSED PHYSICAL EXAM:  unable to complete, working remotely due to COVID restrictions  Diet Order:   Diet Order            Diet Heart Room service appropriate? Yes; Fluid consistency: Thin  Diet effective now              EDUCATION NEEDS:   Not appropriate for education at this time  Skin:  Skin Assessment: Reviewed RN Assessment  Last BM:  8/11  Height:   Ht Readings from Last 1 Encounters:  10/06/18 5\' 9"  (1.753 m)    Weight:   Wt Readings from Last 1 Encounters:  10/06/18 128.4 kg    Ideal Body Weight:  72.7 kg  BMI:  Body mass index is 41.79 kg/m.  Estimated Nutritional Needs:   Kcal:  1950-2150  Protein:  110-130 gm  Fluid:  2 L    Molli Barrows, RD, LDN, [place] Pager [phone] After Hours Pager [phone]

## 2018-10-07 NOTE — H&P (Signed)
History and Physical    Ricky Glass VHQ:[id number] DOB: [birth year] DOA: 10/06/2018  PCP: System, Pcp Not In Patient coming from: Home  Chief Complaint: Shortness of breath, chest pain  HPI: Ricky Glass is a 53 y.o. male with medical history significant of type 2 diabetes, gout presenting to the hospital via EMS for evaluation of shortness of breath and chest pain.  Shortness of breath and chest pain.  Received aspirin 324 mg by EMS.  Patient was diagnosed with COVID-19 viral infection after being tested on October 01, 2018.  Patient states initially he lost his sense of of smell and taste.  2 days ago he started having body aches.  This morning since he woke up he has been having dyspnea and substernal chest pain only with exertion.  His temperature at home was 104 F.  States the chest pain was pressure-like and nonradiating.  Chest pain resolved since he has been in the ED and he is feeling good at present.  Denies history of MI or coronary artery disease.  States his appetite is down today and he is feeling tired.  Denies nausea, vomiting, or abdominal pain.  ED Course: Temperature 100.1 F.  Slightly tachycardic.  Not tachypneic or hypoxic.  Not hypotensive.  No leukocytosis.  Lactic acid normal.  Bicarb 20.  Creatinine 1.3, at baseline.  High-sensitivity troponin x1 negative.  EKG without acute ischemic changes.  Blood culture x2 pending.  UA and urine culture pending.  Chest x-ray showing patchy bibasilar infiltrates.  CT angiogram negative for PE.  Showing multifocal patchy/groundglass opacities in all lobes with peripheral/subpleural distribution compatible with history of COVID-19 and trace bilateral pleural effusions. Patient received Tylenol, ceftriaxone, azithromycin, and 1 L normal saline bolus.  Review of Systems:  All systems reviewed and apart from history of presenting illness, are negative.  Past Medical History:  Diagnosis Date   Diabetes mellitus without complication ([facility])     Gout     History reviewed. No pertinent surgical history.   reports that he has never smoked. He has never used smokeless tobacco. He reports that he does not drink alcohol or use drugs.  Allergies  Allergen Reactions   Aspirin     Childhood reaction    Family History  Problem Relation Age of Onset   Stroke Mother     Prior to Admission medications   Medication Sig Start Date End Date Taking? Authorizing Provider  allopurinol (ZYLOPRIM) 100 MG tablet Take 100 mg by mouth daily.    [provider]  ibuprofen (ADVIL,MOTRIN) 800 MG tablet Take 1 tablet (800 mg total) by mouth 3 (three) times daily. 06/10/17   Wieters, Hallie C, PA-C  methocarbamol (ROBAXIN) 500 MG tablet Take 1 tablet (500 mg total) by mouth at bedtime as needed for muscle spasms. 03/12/18   Caccavale, Sophia, PA-C  naproxen (NAPROSYN) 500 MG tablet Take 1 tablet (500 mg total) by mouth 2 (two) times daily with a meal. 03/12/18   Caccavale, Sophia, PA-C  predniSONE (DELTASONE) 20 MG tablet 3 tabs po day one, then 2 tabs daily x 4 days 03/13/18   Domenic Moras, PA-C  traMADol (ULTRAM) 50 MG tablet Take 1 tablet (50 mg total) by mouth every 6 (six) hours as needed. 03/13/18   Domenic Moras, PA-C    Physical Exam: Vitals:   10/06/18 2355 10/07/18 0000 10/07/18 0127 10/07/18 0235  BP:  (!) 140/111 (!) 131/95 (!) 140/102  Pulse:  83 75   Resp:  14 16  Temp:    98.4 F (36.9 C)  TempSrc:    Oral  SpO2:  99% 98%   Weight: 128.4 kg     Height: 5\' 9"  (1.753 m)       Physical Exam  Constitutional: He is oriented to person, place, and time. He appears well-developed and well-nourished. No distress.  HENT:  Head: Normocephalic.  Eyes: Right eye exhibits no discharge. Left eye exhibits no discharge.  Neck: Neck supple.  Cardiovascular: Normal rate, regular rhythm and intact distal pulses.  Pulmonary/Chest: Effort normal. No respiratory distress. He has no wheezes.  Decreased breath sounds at the bases    Abdominal: Soft. Bowel sounds are normal. He exhibits no distension. There is no abdominal tenderness. There is no guarding.  Musculoskeletal:        General: No edema.  Neurological: He is alert and oriented to person, place, and time.  Skin: Skin is warm and dry. He is not diaphoretic.     Labs on Admission: I have personally reviewed following labs and imaging studies  CBC: Recent Labs  Lab 10/06/18 2043  WBC 10.3  HGB 14.8  HCT 46.1  MCV 83.1  PLT 194   Basic Metabolic Panel: Recent Labs  Lab 10/06/18 2043  NA 136  K 4.5  CL 103  CO2 20*  GLUCOSE 162*  BUN 15  CREATININE 1.35*  CALCIUM 8.8*   GFR: Estimated Creatinine Clearance: 86.9 mL/min (A) (by C-G formula based on SCr of 1.35 mg/dL (H)). Liver Function Tests: No results for input(s): AST, ALT, ALKPHOS, BILITOT, PROT, ALBUMIN in the last 168 hours. No results for input(s): LIPASE, AMYLASE in the last 168 hours. No results for input(s): AMMONIA in the last 168 hours. Coagulation Profile: No results for input(s): INR, PROTIME in the last 168 hours. Cardiac Enzymes: No results for input(s): CKTOTAL, CKMB, CKMBINDEX, TROPONINI in the last 168 hours. BNP (last 3 results) No results for input(s): PROBNP in the last 8760 hours. HbA1C: No results for input(s): HGBA1C in the last 72 hours. CBG: Recent Labs  Lab 10/07/18 0130  GLUCAP 165*   Lipid Profile: Recent Labs    10/07/18 0059  TRIG 61   Thyroid Function Tests: No results for input(s): TSH, T4TOTAL, FREET4, T3FREE, THYROIDAB in the last 72 hours. Anemia Panel: Recent Labs    10/07/18 0058  FERRITIN 192   Urine analysis:    Component Value Date/Time   COLORURINE YELLOW 10/07/2018 0002   APPEARANCEUR CLEAR 10/07/2018 0002   LABSPEC 1.042 (H) 10/07/2018 0002   PHURINE 6.0 10/07/2018 0002   GLUCOSEU NEGATIVE 10/07/2018 0002   HGBUR NEGATIVE 10/07/2018 0002   BILIRUBINUR NEGATIVE 10/07/2018 0002   BILIRUBINUR neg 06/27/2014 1901    KETONESUR 5 (A) 10/07/2018 0002   PROTEINUR NEGATIVE 10/07/2018 0002   UROBILINOGEN 1.0 06/27/2014 1901   UROBILINOGEN 1.0 09/27/2012 1227   NITRITE NEGATIVE 10/07/2018 0002   LEUKOCYTESUR NEGATIVE 10/07/2018 0002    Radiological Exams on Admission: Ct Angio Chest Pe W And/or Wo Contrast  Result Date: 10/06/2018 CLINICAL DATA:  Shortness of breath, chest pain, COVID positive EXAM: CT ANGIOGRAPHY CHEST WITH CONTRAST TECHNIQUE: Multidetector CT imaging of the chest was performed using the standard protocol during bolus administration of intravenous contrast. Multiplanar CT image reconstructions and MIPs were obtained to evaluate the vascular anatomy. CONTRAST:  75mL OMNIPAQUE IOHEXOL 350 MG/ML SOLN COMPARISON:  None. FINDINGS: Cardiovascular: Suboptimal evaluation of the pulmonary arteries due to preferential opacification of the aorta at the time of imaging. Within  that constraint, there is no evidence of central pulmonary embolism to the lobar level. No evidence of thoracic aortic aneurysm or dissection. Heart is normal in size.  No pericardial effusion. Mediastinum/Nodes: No suspicious mediastinal lymphadenopathy. Visualized thyroid is unremarkable. Lungs/Pleura: Multifocal patchy/ground-glass opacities in all lobes, with a peripheral/subpleural distribution, compatible with the clinical history of COVID-19. Trace bilateral pleural effusions. No suspicious pulmonary nodules. No pneumothorax. Upper Abdomen: Visualized upper abdomen is unremarkable. Musculoskeletal: Visualized osseous structures are within normal limits. Review of the MIP images confirms the above findings. IMPRESSION: Limited evaluation.  No evidence of pulmonary embolism. Multifocal pneumonia in this patient with clinical history of COVID-19. Trace bilateral pleural effusions. Electronically Signed   By: Charline BillsSriyesh  Krishnan M.D.   On: 10/06/2018 23:27   Dg Chest Portable 1 View  Result Date: 10/06/2018 CLINICAL DATA:  Known COVID-19  positivity with chest pain and shortness of breath EXAM: PORTABLE CHEST 1 VIEW COMPARISON:  02/22/12 FINDINGS: Cardiac shadow is within normal limits. Patchy bibasilar infiltrates are seen consistent with the patient's given clinical history. No sizable effusion is noted. No bony abnormality is noted. IMPRESSION: Patchy bibasilar infiltrates consistent with the given clinical history. Electronically Signed   By: Alcide CleverMark  Lukens M.D.   On: 10/06/2018 21:22    EKG: Independently reviewed.  Sinus tachycardia, heart rate 104.  No acute ischemic changes.  Assessment/Plan Principal Problem:   Pneumonia due to COVID-19 virus Active Problems:   Chest pain   Type 2 diabetes mellitus ([facility])   Multifocal pneumonia secondary to COVID-19 viral infection Temperature 100.1 F.  Slightly tachycardic on arrival, now resolved.  Not tachypneic or hypoxic.  No leukocytosis.  Lactic acid normal.  CT angiogram negative for PE but showing evidence of multifocal pneumonia.  Procalcitonin negative.  Inflammatory markers including CRP and fibrinogen elevated. -Solu-Medrol 1 mg/kg/day divided into 2 daily doses -Currently does not meet criteria for Remdesivir or Actemra given no hypoxia. -Daily CBC with differential, CMP, CRP, ferritin, d-dimer -Airborne and contact precautions -Continuous pulse ox -Supplemental oxygen if needed -Blood culture x2 pending -Tylenol PRN  Chest pain ACS less likely as high-sensitivity troponin x2 negative.  EKG without acute ischemic changes.  Currently chest pain-free. -Cardiac monitoring -Continue to monitor  Diet controlled type 2 diabetes -Check A1c.  Sliding scale insulin and CBG checks.  DVT prophylaxis: Lovenox Code Status: Full code Family Communication: No family available at this time. Disposition Plan: Anticipate discharge after clinical improvement. Consults called: None Admission status: It is my clinical opinion that admission to INPATIENT is reasonable and necessary  in this 53 y.o. male  presenting with multifocal pneumonia secondary to COVID-19 viral infection.  High risk of decompensation.  Needs close monitoring over the course of the next few days.  Given the aforementioned, the predictability of an adverse outcome is felt to be significant. I expect that the patient will require at least 2 midnights in the hospital to treat this condition.   The medical decision making on this patient was of high complexity and the patient is at high risk for clinical deterioration, therefore this is a level 3 visit.  John GiovanniVasundhra Hazleigh Mccleave MD [facility] Pager [phone]  If 7PM-7AM, please contact night-coverage [email or web address] Password [email or web address]  10/07/2018, 4:27 AM

## 2018-10-08 ENCOUNTER — Inpatient Hospital Stay (HOSPITAL_COMMUNITY): Payer: HRSA Program

## 2018-10-08 LAB — CBC WITH DIFFERENTIAL/PLATELET
Abs Immature Granulocytes: 0.14 10*3/uL — ABNORMAL HIGH (ref 0.00–0.07)
Basophils Absolute: 0 10*3/uL (ref 0.0–0.1)
Basophils Relative: 0 %
Eosinophils Absolute: 0 10*3/uL (ref 0.0–0.5)
Eosinophils Relative: 0 %
HCT: 43 % (ref 39.0–52.0)
Hemoglobin: 13.4 g/dL (ref 13.0–17.0)
Immature Granulocytes: 1 %
Lymphocytes Relative: 10 %
Lymphs Abs: 1.4 10*3/uL (ref 0.7–4.0)
MCH: 26 pg (ref 26.0–34.0)
MCHC: 31.2 g/dL (ref 30.0–36.0)
MCV: 83.3 fL (ref 80.0–100.0)
Monocytes Absolute: 0.6 10*3/uL (ref 0.1–1.0)
Monocytes Relative: 5 %
Neutro Abs: 11.8 10*3/uL — ABNORMAL HIGH (ref 1.7–7.7)
Neutrophils Relative %: 84 %
Platelets: 238 10*3/uL (ref 150–400)
RBC: 5.16 MIL/uL (ref 4.22–5.81)
RDW: 13.5 % (ref 11.5–15.5)
WBC: 14 10*3/uL — ABNORMAL HIGH (ref 4.0–10.5)
nRBC: 0 % (ref 0.0–0.2)

## 2018-10-08 LAB — COMPREHENSIVE METABOLIC PANEL
ALT: 29 U/L (ref 0–44)
AST: 20 U/L (ref 15–41)
Albumin: 3.4 g/dL — ABNORMAL LOW (ref 3.5–5.0)
Alkaline Phosphatase: 47 U/L (ref 38–126)
Anion gap: 14 (ref 5–15)
BUN: 25 mg/dL — ABNORMAL HIGH (ref 6–20)
CO2: 22 mmol/L (ref 22–32)
Calcium: 8.9 mg/dL (ref 8.9–10.3)
Chloride: 104 mmol/L (ref 98–111)
Creatinine, Ser: 1.29 mg/dL — ABNORMAL HIGH (ref 0.61–1.24)
GFR calc Af Amer: >60 mL/min (ref >60)
GFR calc non Af Amer: >60 mL/min (ref >60)
Glucose, Bld: 157 mg/dL — ABNORMAL HIGH (ref 70–99)
Potassium: 4.6 mmol/L (ref 3.5–5.1)
Sodium: 140 mmol/L (ref 135–145)
Total Bilirubin: 0.4 mg/dL (ref 0.3–1.2)
Total Protein: 7.1 g/dL (ref 6.5–8.1)

## 2018-10-08 LAB — D-DIMER, QUANTITATIVE: D-Dimer, Quant: 0.29 ug/mL-FEU (ref 0.00–0.50)

## 2018-10-08 LAB — C-REACTIVE PROTEIN: CRP: 7.8 mg/dL — ABNORMAL HIGH (ref <1.0)

## 2018-10-08 LAB — URINE CULTURE: Culture: NO GROWTH

## 2018-10-08 LAB — FERRITIN: Ferritin: 229 ng/mL (ref 24–336)

## 2018-10-08 LAB — GLUCOSE, CAPILLARY: Glucose-Capillary: 187 mg/dL — ABNORMAL HIGH (ref 70–99)

## 2018-10-08 MED ORDER — DEXAMETHASONE 6 MG PO TABS: 6.0000 mg | ORAL_TABLET | Freq: Every day | ORAL | 0 refills | Status: DC

## 2018-10-08 MED ORDER — DEXAMETHASONE 6 MG PO TABS: 6.0000 mg | ORAL_TABLET | Freq: Every day | ORAL | Status: DC

## 2018-10-08 MED ORDER — ACETAMINOPHEN 325 MG PO TABS: 650.0000 mg | ORAL_TABLET | Freq: Four times a day (QID) | ORAL | Status: DC | PRN

## 2018-10-08 NOTE — Progress Notes (Signed)
Spoke with daughter and updates given, questions answered

## 2018-10-08 NOTE — Discharge Instructions (Signed)
COVID-19 °COVID-19 is a respiratory infection that is caused by a virus called severe acute respiratory syndrome coronavirus 2 (SARS-CoV-2). The disease is also known as coronavirus disease or novel coronavirus. In some people, the virus may not cause any symptoms. In others, it may cause a serious infection. The infection can get worse quickly and can lead to complications, such as: °· Pneumonia, or infection of the lungs. °· Acute respiratory distress syndrome or ARDS. This is fluid build-up in the lungs. °· Acute respiratory failure. This is a condition in which there is not enough oxygen passing from the lungs to the body. °· Sepsis or septic shock. This is a serious bodily reaction to an infection. °· Blood clotting problems. °· Secondary infections due to bacteria or fungus. °The virus that causes COVID-19 is contagious. This means that it can spread from person to person through droplets from coughs and sneezes (respiratory secretions). °What are the causes? °This illness is caused by a virus. You may catch the virus by: °· Breathing in droplets from an infected person's cough or sneeze. °· Touching something, like a table or a doorknob, that was exposed to the virus (contaminated) and then touching your mouth, nose, or eyes. °What increases the risk? °Risk for infection °You are more likely to be infected with this virus if you: °· Live in or travel to an area with a COVID-19 outbreak. °· Come in contact with a sick person who recently traveled to an area with a COVID-19 outbreak. °· Provide care for or live with a person who is infected with COVID-19. °Risk for serious illness °You are more likely to become seriously ill from the virus if you: °· Are 65 years of age or older. °· Have a long-term disease that lowers your body's ability to fight infection (immunocompromised). °· Live in a nursing home or long-term care facility. °· Have a long-term (chronic) disease such as: °? Chronic lung disease, including  chronic obstructive pulmonary disease or asthma °? Heart disease. °? Diabetes. °? Chronic kidney disease. °? Liver disease. °· Are obese. °What are the signs or symptoms? °Symptoms of this condition can range from mild to severe. Symptoms may appear any time from 2 to 14 days after being exposed to the virus. They include: °· A fever. °· A cough. °· Difficulty breathing. °· Chills. °· Muscle pains. °· A sore throat. °· Loss of taste or smell. °Some people may also have stomach problems, such as nausea, vomiting, or diarrhea. °Other people may not have any symptoms of COVID-19. °How is this diagnosed? °This condition may be diagnosed based on: °· Your signs and symptoms, especially if: °? You live in an area with a COVID-19 outbreak. °? You recently traveled to or from an area where the virus is common. °? You provide care for or live with a person who was diagnosed with COVID-19. °· A physical exam. °· Lab tests, which may include: °? A nasal swab to take a sample of fluid from your nose. °? A throat swab to take a sample of fluid from your throat. °? A sample of mucus from your lungs (sputum). °? Blood tests. °· Imaging tests, which may include, X-rays, CT scan, or ultrasound. °How is this treated? °At present, there is no medicine to treat COVID-19. Medicines that treat other diseases are being used on a trial basis to see if they are effective against COVID-19. °Your health care provider will talk with you about ways to treat your symptoms. For most   people, the infection is mild and can be managed at home with rest, fluids, and over-the-counter medicines. °Treatment for a serious infection usually takes places in a hospital intensive care unit (ICU). It may include one or more of the following treatments. These treatments are given until your symptoms improve. °· Receiving fluids and medicines through an IV. °· Supplemental oxygen. Extra oxygen is given through a tube in the nose, a face mask, or a  hood. °· Positioning you to lie on your stomach (prone position). This makes it easier for oxygen to get into the lungs. °· Continuous positive airway pressure (CPAP) or bi-level positive airway pressure (BPAP) machine. This treatment uses mild air pressure to keep the airways open. A tube that is connected to a motor delivers oxygen to the body. °· Ventilator. This treatment moves air into and out of the lungs by using a tube that is placed in your windpipe. °· Tracheostomy. This is a procedure to create a hole in the neck so that a breathing tube can be inserted. °· Extracorporeal membrane oxygenation (ECMO). This procedure gives the lungs a chance to recover by taking over the functions of the heart and lungs. It supplies oxygen to the body and removes carbon dioxide. °Follow these instructions at home: °Lifestyle °· If you are sick, stay home except to get medical care. Your health care provider will tell you how long to stay home. Call your health care provider before you go for medical care. °· Rest at home as told by your health care provider. °· Do not use any products that contain nicotine or tobacco, such as cigarettes, e-cigarettes, and chewing tobacco. If you need help quitting, ask your health care provider. °· Return to your normal activities as told by your health care provider. Ask your health care provider what activities are safe for you. °General instructions °· Take over-the-counter and prescription medicines only as told by your health care provider. °· Drink enough fluid to keep your urine pale yellow. °· Keep all follow-up visits as told by your health care provider. This is important. °How is this prevented? ° °There is no vaccine to help prevent COVID-19 infection. However, there are steps you can take to protect yourself and others from this virus. °To protect yourself:  °· Do not travel to areas where COVID-19 is a risk. The areas where COVID-19 is reported change often. To identify  high-risk areas and travel restrictions, check the CDC travel website: wwwnc.cdc.gov/travel/notices °· If you live in, or must travel to, an area where COVID-19 is a risk, take precautions to avoid infection. °? Stay away from people who are sick. °? Wash your hands often with soap and water for 20 seconds. If soap and water are not available, use an alcohol-based hand sanitizer. °? Avoid touching your mouth, face, eyes, or nose. °? Avoid going out in public, follow guidance from your state and local health authorities. °? If you must go out in public, wear a cloth face covering or face mask. °? Disinfect objects and surfaces that are frequently touched every day. This may include: °§ Counters and tables. °§ Doorknobs and light switches. °§ Sinks and faucets. °§ Electronics, such as phones, remote controls, keyboards, computers, and tablets. °To protect others: °If you have symptoms of COVID-19, take steps to prevent the virus from spreading to others. °· If you think you have a COVID-19 infection, contact your health care provider right away. Tell your health care team that you think you may   have a COVID-19 infection. °· Stay home. Leave your house only to seek medical care. Do not use public transport. °· Do not travel while you are sick. °· Wash your hands often with soap and water for 20 seconds. If soap and water are not available, use alcohol-based hand sanitizer. °· Stay away from other members of your household. Let healthy household members care for children and pets, if possible. If you have to care for children or pets, wash your hands often and wear a mask. If possible, stay in your own room, separate from others. Use a different bathroom. °· Make sure that all people in your household wash their hands well and often. °· Cough or sneeze into a tissue or your sleeve or elbow. Do not cough or sneeze into your hand or into the air. °· Wear a cloth face covering or face mask. °Where to find more  information °· Centers for Disease Control and Prevention: www.cdc.gov/coronavirus/2019-ncov/index.html °· World Health Organization: www.who.int/health-topics/coronavirus °Contact a health care provider if: °· You live in or have traveled to an area where COVID-19 is a risk and you have symptoms of the infection. °· You have had contact with someone who has COVID-19 and you have symptoms of the infection. °Get help right away if: °· You have trouble breathing. °· You have pain or pressure in your chest. °· You have confusion. °· You have bluish lips and fingernails. °· You have difficulty waking from sleep. °· You have symptoms that get worse. °These symptoms may represent a serious problem that is an emergency. Do not wait to see if the symptoms will go away. Get medical help right away. Call your local emergency services (911 in the [place] Do not drive yourself to the hospital. Let the emergency medical personnel know if you think you have COVID-19. °Summary °· COVID-19 is a respiratory infection that is caused by a virus. It is also known as coronavirus disease or novel coronavirus. It can cause serious infections, such as pneumonia, acute respiratory distress syndrome, acute respiratory failure, or sepsis. °· The virus that causes COVID-19 is contagious. This means that it can spread from person to person through droplets from coughs and sneezes. °· You are more likely to develop a serious illness if you are 65 years of age or older, have a weak immunity, live in a nursing home, or have chronic disease. °· There is no medicine to treat COVID-19. Your health care provider will talk with you about ways to treat your symptoms. °· Take steps to protect yourself and others from infection. Wash your hands often and disinfect objects and surfaces that are frequently touched every day. Stay away from people who are sick and wear a mask if you are sick. °This information is not intended to replace advice given to you by  your health care provider. Make sure you discuss any questions you have with your health care provider. °Document Released: 03/19/2018 Document Revised: 07/09/2018 Document Reviewed: 03/19/2018 °Elsevier Patient Education © 2020 [employer] ° ° ° °COVID-19 Frequently Asked Questions °COVID-19 (coronavirus disease) is an infection that is caused by a large family of viruses. Some viruses cause illness in people and others cause illness in animals like camels, cats, and bats. In some cases, the viruses that cause illness in animals can spread to humans. °Where did the coronavirus come from? °In December 2019, [place] told the World Health Organization (WHO) of several cases of lung disease (human respiratory illness). These cases were linked to   an open seafood and livestock market in the city of [place]. The link to the seafood and livestock market suggests that the virus may have spread from animals to humans. However, since that first outbreak in December, the virus has also been shown to spread from person to person. °What is the name of the disease and the virus? °Disease name °Early on, this disease was called novel coronavirus. This is because scientists determined that the disease was caused by a new (novel) respiratory virus. The World Health Organization (WHO) has now named the disease COVID-19, or coronavirus disease. °Virus name °The virus that causes the disease is called severe acute respiratory syndrome coronavirus 2 (SARS-CoV-2). °More information on disease and virus naming °World Health Organization (WHO): www.who.int/emergencies/diseases/novel-coronavirus-2019/technical-guidance/naming-the-coronavirus-disease-(covid-2019)-and-the-virus-that-causes-it °Who is at risk for complications from coronavirus disease? °Some people may be at higher risk for complications from coronavirus disease. This includes older adults and people who have chronic diseases, such as heart disease, diabetes, and lung  disease. °If you are at higher risk for complications, take these extra precautions: °· Avoid close contact with people who are sick or have a fever or cough. Stay at least 3-6 ft (1-2 m) away from them, if possible. °· Wash your hands often with soap and water for at least 20 seconds. °· Avoid touching your face, mouth, nose, or eyes. °· Keep supplies on hand at home, such as food, medicine, and cleaning supplies. °· Stay home as much as possible. °· Avoid social gatherings and travel. °How does coronavirus disease spread? °The virus that causes coronavirus disease spreads easily from person to person (is contagious). There are also cases of community-spread disease. This means the disease has spread to: °· People who have no known contact with other infected people. °· People who have not traveled to areas where there are known cases. °It appears to spread from one person to another through droplets from coughing or sneezing. °Can I get the virus from touching surfaces or objects? °There is still a lot that we do not know about the virus that causes coronavirus disease. Scientists are basing a lot of information on what they know about similar viruses, such as: °· Viruses cannot generally survive on surfaces for long. They need a human body (host) to survive. °· It is more likely that the virus is spread by close contact with people who are sick (direct contact), such as through: °? Shaking hands or hugging. °? Breathing in respiratory droplets that travel through the air. This can happen when an infected person coughs or sneezes on or near other people. °· It is less likely that the virus is spread when a person touches a surface or object that has the virus on it (indirect contact). The virus may be able to enter the body if the person touches a surface or object and then touches his or her face, eyes, nose, or mouth. °Can a person spread the virus without having symptoms of the disease? °It may be possible for  the virus to spread before a person has symptoms of the disease, but this is most likely not the main way the virus is spreading. It is more likely for the virus to spread by being in close contact with people who are sick and breathing in the respiratory droplets of a sick person's cough or sneeze. °What are the symptoms of coronavirus disease? °Symptoms vary from person to person and can range from mild to severe. Symptoms may include: °· Fever. °· Cough. °· Tiredness,   weakness, or fatigue. °· Fast breathing or feeling short of breath. °These symptoms can appear anywhere from 2 to 14 days after you have been exposed to the virus. If you develop symptoms, call your health care provider. People with severe symptoms may need hospital care. °If I am exposed to the virus, how long does it take before symptoms start? °Symptoms of coronavirus disease may appear anywhere from 2 to 14 days after a person has been exposed to the virus. If you develop symptoms, call your health care provider. °Should I be tested for this virus? °Your health care provider will decide whether to test you based on your symptoms, history of exposure, and your risk factors. °How does a health care provider test for this virus? °Health care providers will collect samples to send for testing. Samples may include: °· Taking a swab of fluid from the nose. °· Taking fluid from the lungs by having you cough up mucus (sputum) into a sterile cup. °· Taking a blood sample. °· Taking a stool or urine sample. °Is there a treatment or vaccine for this virus? °Currently, there is no vaccine to prevent coronavirus disease. Also, there are no medicines like antibiotics or antivirals to treat the virus. A person who becomes sick is given supportive care, which means rest and fluids. A person may also relieve his or her symptoms by using over-the-counter medicines that treat sneezing, coughing, and runny nose. These are the same medicines that a person takes for  the common cold. °If you develop symptoms, call your health care provider. People with severe symptoms may need hospital care. °What can I do to protect myself and my family from this virus? ° °  ° °You can protect yourself and your family by taking the same actions that you would take to prevent the spread of other viruses. Take the following actions: °· Wash your hands often with soap and water for at least 20 seconds. If soap and water are not available, use alcohol-based hand sanitizer. °· Avoid touching your face, mouth, nose, or eyes. °· Cough or sneeze into a tissue, sleeve, or elbow. Do not cough or sneeze into your hand or the air. °? If you cough or sneeze into a tissue, throw it away immediately and wash your hands. °· Disinfect objects and surfaces that you frequently touch every day. °· Avoid close contact with people who are sick or have a fever or cough. Stay at least 3-6 ft (1-2 m) away from them, if possible. °· Stay home if you are sick, except to get medical care. Call your health care provider before you get medical care. °· Make sure your vaccines are up to date. Ask your health care provider what vaccines you need. °What should I do if I need to travel? °Follow travel recommendations from your local health authority, the CDC, and WHO. °Travel information and advice °· Centers for Disease Control and Prevention (CDC): www.cdc.gov/coronavirus/2019-ncov/travelers/index.html °· World Health Organization (WHO): www.who.int/emergencies/diseases/novel-coronavirus-2019/travel-advice °Know the risks and take action to protect your health °· You are at higher risk of getting coronavirus disease if you are traveling to areas with an outbreak or if you are exposed to travelers from areas with an outbreak. °· Wash your hands often and practice good hygiene to lower the risk of catching or spreading the virus. °What should I do if I am sick? °General instructions to stop the spread of infection °· Wash your  hands often with soap and water for at least 20   seconds. If soap and water are not available, use alcohol-based hand sanitizer. °· Cough or sneeze into a tissue, sleeve, or elbow. Do not cough or sneeze into your hand or the air. °· If you cough or sneeze into a tissue, throw it away immediately and wash your hands. °· Stay home unless you must get medical care. Call your health care provider or local health authority before you get medical care. °· Avoid public areas. Do not take public transportation, if possible. °· If you can, wear a mask if you must go out of the house or if you are in close contact with someone who is not sick. °Keep your home clean °· Disinfect objects and surfaces that are frequently touched every day. This may include: °? Counters and tables. °? Doorknobs and light switches. °? Sinks and faucets. °? Electronics such as phones, remote controls, keyboards, computers, and tablets. °· Wash dishes in hot, soapy water or use a dishwasher. Air-dry your dishes. °· Wash laundry in hot water. °Prevent infecting other household members °· Let healthy household members care for children and pets, if possible. If you have to care for children or pets, wash your hands often and wear a mask. °· Sleep in a different bedroom or bed, if possible. °· Do not share personal items, such as razors, toothbrushes, deodorant, combs, brushes, towels, and washcloths. °Where to find more information °Centers for Disease Control and Prevention (CDC) °· Information and news updates: www.cdc.gov/coronavirus/2019-ncov °World Health Organization (WHO) °· Information and news updates: www.who.int/emergencies/diseases/novel-coronavirus-2019 °· Coronavirus health topic: www.who.int/health-topics/coronavirus °· Questions and answers on COVID-19: www.who.int/news-room/q-a-detail/q-a-coronaviruses °· Global tracker: who.sprinklr.com °American Academy of Pediatrics (AAP) °· Information for families:  www.healthychildren.org/English/health-issues/conditions/chest-lungs/Pages/2019-Novel-Coronavirus.aspx °The coronavirus situation is changing rapidly. Check your local health authority website or the CDC and WHO websites for updates and news. °When should I contact a health care provider? °· Contact your health care provider if you have symptoms of an infection, such as fever or cough, and you: °? Have been near anyone who is known to have coronavirus disease. °? Have come into contact with a person who is suspected to have coronavirus disease. °? Have traveled outside of the country. °When should I get emergency medical care? °· Get help right away by calling your local emergency services (911 in the [place] if you have: °? Trouble breathing. °? Pain or pressure in your chest. °? Confusion. °? Blue-tinged lips and fingernails. °? Difficulty waking from sleep. °? Symptoms that get worse. °Let the emergency medical personnel know if you think you have coronavirus disease. °Summary °· A new respiratory virus is spreading from person to person and causing COVID-19 (coronavirus disease). °· The virus that causes COVID-19 appears to spread easily. It spreads from one person to another through droplets from coughing or sneezing. °· Older adults and those with chronic diseases are at higher risk of disease. If you are at higher risk for complications, take extra precautions. °· There is currently no vaccine to prevent coronavirus disease. There are no medicines, such as antibiotics or antivirals, to treat the virus. °· You can protect yourself and your family by washing your hands often, avoiding touching your face, and covering your coughs and sneezes. °This information is not intended to replace advice given to you by your health care provider. Make sure you discuss any questions you have with your health care provider. °Document Released: 06/09/2018 Document Revised: 06/09/2018 Document Reviewed: 06/09/2018 °Elsevier  Patient Education © 2020 [employer]. ° ° °COVID-19: How to   Protect Yourself and Others °Know how it spreads °· There is currently no vaccine to prevent coronavirus disease 2019 (COVID-19). °· The best way to prevent illness is to avoid being exposed to this virus. °· The virus is thought to spread mainly from person-to-person. °? Between people who are in close contact with one another (within about 6 feet). °? Through respiratory droplets produced when an infected person coughs, sneezes or talks. °? These droplets can land in the mouths or noses of people who are nearby or possibly be inhaled into the lungs. °? Some recent studies have suggested that COVID-19 may be spread by people who are not showing symptoms. °Everyone should °Clean your hands often °· Wash your hands often with soap and water for at least 20 seconds especially after you have been in a public place, or after blowing your nose, coughing, or sneezing. °· If soap and water are not readily available, use a hand sanitizer that contains at least 60% alcohol. Cover all surfaces of your hands and rub them together until they feel dry. °· Avoid touching your eyes, nose, and mouth with unwashed hands. °Avoid close contact °· Stay home if you are sick. °· Avoid close contact with people who are sick. °· Put distance between yourself and other people. °? Remember that some people without symptoms may be able to spread virus. °? This is especially important for people who are at higher risk of getting very sick.www.cdc.gov/coronavirus/2019-ncov/need-extra-precautions/people-at-higher-risk.html °Cover your mouth and nose with a cloth face cover when around others °· You could spread COVID-19 to others even if you do not feel sick. °· Everyone should wear a cloth face cover when they have to go out in public, for example to the grocery store or to pick up other necessities. °? Cloth face coverings should not be placed on young children under age 2, anyone  who has trouble breathing, or is unconscious, incapacitated or otherwise unable to remove the mask without assistance. °· The cloth face cover is meant to protect other people in case you are infected. °· Do NOT use a facemask meant for a healthcare worker. °· Continue to keep about 6 feet between yourself and others. The cloth face cover is not a substitute for social distancing. °Cover coughs and sneezes °· If you are in a private setting and do not have on your cloth face covering, remember to always cover your mouth and nose with a tissue when you cough or sneeze or use the inside of your elbow. °· Throw used tissues in the trash. °· Immediately wash your hands with soap and water for at least 20 seconds. If soap and water are not readily available, clean your hands with a hand sanitizer that contains at least 60% alcohol. °Clean and disinfect °· Clean AND disinfect frequently touched surfaces daily. This includes tables, doorknobs, light switches, countertops, handles, desks, phones, keyboards, toilets, faucets, and sinks. www.cdc.gov/coronavirus/2019-ncov/prevent-getting-sick/disinfecting-your-home.html °· If surfaces are dirty, clean them: Use detergent or soap and water prior to disinfection. °· Then, use a household disinfectant. You can see a list of EPA-registered household disinfectants here. °cdc.gov/coronavirus °06/30/2018 °This information is not intended to replace advice given to you by your health care provider. Make sure you discuss any questions you have with your health care provider. °Document Released: 06/09/2018 Document Revised: 07/08/2018 Document Reviewed: 06/09/2018 °Elsevier Patient Education © 2020 Elsevier Inc. ° °

## 2018-10-08 NOTE — Progress Notes (Signed)
Called daughter per Pt for updates with no answer. Will try later.

## 2018-10-08 NOTE — Discharge Summary (Signed)
DISCHARGE SUMMARY  Ricky Glass  MR#: [id number]  DOB:April 30, 1965  Date of Admission: 10/06/2018 Date of Discharge: 10/08/2018  Attending Physician:Jeffrey Hennie Duos, MD  Patient's PTW:SFKCLE, Pcp Not In  Consults: None  Disposition: D/C home   Follow-up Appts: Follow-up Information    [facility] Follow up.   Why: You are scheduled for a telephone hospital follow up call on Wed. 10/21/18 at 1:50pm. please be available for this call. If you must rescehdule please call [phone] Contact information: [place] [id number] [phone]          Tests Needing Follow-up: -assess CBG control when off steroids - may need to begin metformin  COVID-19 specific Treatment: Steroids 8/12 >  Discharge Diagnoses: COVID Pneumonia  Chest pain - noncardiac  DM 2 Obesity - BMI 41.79 kg/m   Initial presentation: 53 year old with a history of DM 2 and gout who presented to the ED via EMS with complaints of shortness of breath and chest pain.  The patient was found to be COVID positive on August 6 after he lost his sense of smell and taste.  Since that time he reported progressive body aches, worsening shortness of breath, substernal chest discomfort, and a temperature as high as 104.  In the ED the patient was not tachypneic nor hypoxic.  He had a negative troponin and an EKG was without acute changes.  A chest x-ray confirmed patchy bibasilar infiltrates and a CT angiogram of the chest was negative for PE.  Hospital Course: 8/6 COVID test positive 8/12 admit via [facility] 8/13 d/c home   COVID Pneumonia Despite confirmed patchy infiltrates on the patient's chest x-ray he did not experience hypoxia during his hospital stay -at the time of his discharge he denied any dyspnea whatsoever and was able to walk freely around his room without any difficulty at all -his serum markers remained stable -he was  counseled on the possibility of a recrudescence of symptoms and the need to monitor himself and to seek help immediately should he develop worsening shortness of breath  Recent Labs    10/07/18 0058 10/07/18 0425 10/08/18 0345  DDIMER <0.27  --  0.29  FERRITIN 192  --  229  LDH 171 209*  --   CRP 11.3*  --  7.8*    Chest pain Troponin negative x2 with nonacute EKG - pain resolved -felt to be related to pneumonitis/pneumonia and likely musculoskeletal in origin  DM 2 A1c 8.6 -despite use of steroids CBGs remained in the 160-1 80 range during the hospital stay -the patient was advised to follow a strict diabetic diet -his CBG log should be followed up and a determination will need to be made once he is completely off steroids as to whether he requires medical therapy or diet therapy alone for his diabetes  Obesity - Estimated body mass index is 41.79 kg/m as calculated from the following:   Height as of this encounter: 5\' 9"  (1.753 m).   Weight as of this encounter: 128.4 kg.   Allergies as of 10/08/2018      Reactions   Aspirin    Childhood reaction      Medication List    STOP taking these medications   ibuprofen 200 MG tablet Commonly known as: ADVIL   predniSONE 20 MG tablet Commonly known as: DELTASONE     TAKE these medications   acetaminophen 325 MG tablet Commonly known as: TYLENOL Take 2 tablets (  650 mg total) by mouth every 6 (six) hours as needed for mild pain or headache (fever >/= 101).   dexamethasone 6 MG tablet Commonly known as: DECADRON Take 1 tablet (6 mg total) by mouth daily. Start taking on: October 09, 2018       Day of Discharge BP (!) 138/111   Pulse 94   Temp 98.2 F (36.8 C) (Oral)   Resp 18   Ht 5\' 9"  (1.753 m)   Wt 128.4 kg   SpO2 99%   BMI 41.79 kg/m   Physical Exam: General: No acute respiratory distress Lungs: Clear to auscultation bilaterally without wheezes or crackles Cardiovascular: Regular rate and rhythm without  murmur gallop or rub normal S1 and S2 Abdomen: Nontender, nondistended, soft, bowel sounds positive, no rebound, no ascites, no appreciable mass Extremities: No significant cyanosis, clubbing, or edema bilateral lower extremities  Basic Metabolic Panel: Recent Labs  Lab 10/06/18 2043 10/07/18 0425 10/08/18 0345  NA 136 137 140  K 4.5 4.9 4.6  CL 103 105 104  CO2 20* 24 22  GLUCOSE 162* 194* 157*  BUN 15 19 25*  CREATININE 1.35* 1.35* 1.29*  CALCIUM 8.8* 8.2* 8.9  MG  --  2.1  --   PHOS  --  3.4  --     Liver Function Tests: Recent Labs  Lab 10/07/18 0425 10/08/18 0345  AST 22 20  ALT 26 29  ALKPHOS 50 47  BILITOT 0.4 0.4  PROT 6.9 7.1  ALBUMIN 3.2* 3.4*   CBC: Recent Labs  Lab 10/06/18 2043 10/07/18 0425 10/08/18 0345  WBC 10.3 9.2 14.0*  NEUTROABS  --  7.3 11.8*  HGB 14.8 13.3 13.4  HCT 46.1 43.2 43.0  MCV 83.1 83.2 83.3  PLT 194 203 238    CBG: Recent Labs  Lab 10/07/18 0802 10/07/18 1141 10/07/18 1610 10/07/18 2119 10/08/18 0755  GLUCAP 166* 274* 189* 186* 187*    Recent Results (from the past 240 hour(s))  Novel Coronavirus, NAA ([facility])     Status: Abnormal   Collection Time: 10/01/18 12:00 AM   Specimen: Oropharyngeal(OP) collection in vial transport medium   OROPHARYNGEA  TESTING  Result Value Ref Range Status   SARS-CoV-2, NAA Detected (A) Not Detected Final    Comment: This test was developed and its performance characteristics determined by [facility]. This test has not been FDA cleared or approved. This test has been authorized by FDA under an Emergency Use Authorization (EUA). This test is only authorized for the duration of time the declaration that circumstances exist justifying the authorization of the emergency use of in vitro diagnostic tests for detection of SARS-CoV-2 virus and/or diagnosis of COVID-19 infection under section 564(b)(1) of the Act, 21 U.S.C. 960AVW-0(J)(8360bbb-3(b)(1), unless the authorization is terminated or  revoked sooner. When diagnostic testing is negative, the possibility of a false negative result should be considered in the context of a patient's recent exposures and the presence of clinical signs and symptoms consistent with COVID-19. An individual without symptoms of COVID-19 and who is not shedding SARS-CoV-2 virus would expect to have a negative (not detected) result in this assay.   Blood Culture (routine x 2)     Status: None (Preliminary result)   Collection Time: 10/06/18 10:29 PM   Specimen: BLOOD  Result Value Ref Range Status   Specimen Description BLOOD BLOOD LEFT FOREARM  Final   Special Requests   Final    BOTTLES DRAWN AEROBIC AND ANAEROBIC Blood Culture results may  not be optimal due to an inadequate volume of blood received in culture bottles   Culture   Final    NO GROWTH 2 DAYS Performed at [facility], [address] [place], [place] [address]    Report Status PENDING  Incomplete  Blood Culture (routine x 2)     Status: None (Preliminary result)   Collection Time: 10/06/18 10:33 PM   Specimen: BLOOD RIGHT ARM  Result Value Ref Range Status   Specimen Description BLOOD RIGHT ARM  Final   Special Requests   Final    BOTTLES DRAWN AEROBIC ONLY Blood Culture results may not be optimal due to an inadequate volume of blood received in culture bottles   Culture   Final    NO GROWTH 2 DAYS Performed at [facility], [address] [place], [place] [address]    Report Status PENDING  Incomplete  Urine culture     Status: None   Collection Time: 10/07/18 12:02 AM   Specimen: In/Out Cath Urine  Result Value Ref Range Status   Specimen Description IN/OUT CATH URINE  Final   Special Requests NONE  Final   Culture   Final    NO GROWTH Performed at [facility], [address] [place], [place] [address]    Report Status 10/08/2018 FINAL  Final     Time spent in discharge (includes decision making & examination of pt): 30 minutes  10/08/2018,  6:19 PM   Lonia BloodJeffrey T. McClung, MD [facility]  [phone]

## 2018-10-08 NOTE — Progress Notes (Signed)
   10/08/18   To Whom it may concern,  Ricky Glass was admitted to [facility] on 10/06/2018 and remained under my care in the hospital through 10/08/2018.  He has been advised that he should not return to work until 10/16/18, at which time he will be cleared to resume all of his usual responsibilities.  According to the CDC, an individual is no longer an infectious risk to others and may return to work after the following criteria are satisfied:  You have had no fever for at least 72 hours (that is three full days of no fever without the use of medicine that reduces fevers) AND other symptoms have improved (for example, when your cough or shortness of breath or diarrhea have improved) AND at least 10 days have passed since your symptoms first appeared  Using these criteria, and defining his symptom appearance as the day of his admission when his shortness of breath appeared (10/06/2018), [name] has been advised by his doctor that he will be safe to return to work on 10/16/2018, at which time he should not be a risk to infect others around him.   Sincerely,  Cherene Altes, MD [facility]  [phone]

## 2018-10-11 LAB — CULTURE, BLOOD (ROUTINE X 2)
Culture: NO GROWTH
Culture: NO GROWTH

## 2018-10-18 ENCOUNTER — Telehealth (HOSPITAL_COMMUNITY): Payer: Self-pay | Admitting: *Deleted

## 2018-10-18 ENCOUNTER — Other Ambulatory Visit: Payer: Self-pay

## 2018-10-18 ENCOUNTER — Ambulatory Visit (HOSPITAL_COMMUNITY)
Admission: EM | Admit: 2018-10-18 | Discharge: 2018-10-18 | Disposition: A | Discharge status: Home / Self Care | Payer: Self-pay | Attending: Family Medicine | Admitting: Family Medicine

## 2018-10-18 ENCOUNTER — Encounter (HOSPITAL_COMMUNITY): Payer: Self-pay

## 2018-10-18 DIAGNOSIS — B309 Viral conjunctivitis, unspecified: Secondary | ICD-10-CM

## 2018-10-18 DIAGNOSIS — R0602 Shortness of breath: Secondary | ICD-10-CM

## 2018-10-18 MED ORDER — TOBRADEX 0.3-0.1 % OP OINT: 1.0000 "application " | TOPICAL_OINTMENT | Freq: Three times a day (TID) | OPHTHALMIC | 0 refills | Status: DC

## 2018-10-18 NOTE — ED Provider Notes (Signed)
Ricky Glass    CSN: [id number] Arrival date & time: 10/18/18  1340      History   Chief Complaint Chief Complaint  Patient presents with  . Shortness of Breath  . Eye Problem    HPI Ricky Glass is a 53 y.o. male.   He is presenting with bilateral eye redness and shortness of breath.  He was discharged from the hospital for COVID pneumonia on 8/13.  He was provided steroids which seemed to help with his breathing.  Reports he has been home for most of the discharge and is only left his house once.  He felt good yesterday and was able to mow his lawn.  He feels like a discomfort in the left chest.  He denies any significant struggling to breathe or breathing rapidly.  He feels like his eyes started becoming painful and having photophobia yesterday.  It has continued to today.  He is having to wear sunglasses in order to compensate for the light sensitivity.  Denies any double vision  HPI  Past Medical History:  Diagnosis Date  . Diabetes mellitus without complication ([place])   . Gout     Patient Active Problem List   Diagnosis Date Noted  . Pneumonia due to COVID-19 virus 10/07/2018  . Type 2 diabetes mellitus ([place]) 10/07/2018    History reviewed. No pertinent surgical history.     Home Medications    Prior to Admission medications   Medication Sig Start Date End Date Taking? Authorizing Provider  acetaminophen (TYLENOL) 325 MG tablet Take 2 tablets (650 mg total) by mouth every 6 (six) hours as needed for mild pain or headache (fever >/= 101). 10/08/18   Cherene Altes, MD  dexamethasone (DECADRON) 6 MG tablet Take 1 tablet (6 mg total) by mouth daily. 10/09/18   Cherene Altes, MD  tobramycin-dexamethasone [facility]) ophthalmic ointment Place 1 application into both eyes 3 (three) times daily. 10/18/18   Rosemarie Ax, MD    Family History Family History  Problem Relation Age of Onset  . Stroke Mother     Social History Social History    Tobacco Use  . Smoking status: Never Smoker  . Smokeless tobacco: Never Used  Substance Use Topics  . Alcohol use: No    Alcohol/week: 0.0 standard drinks  . Drug use: No     Allergies   Aspirin   Review of Systems Review of Systems  Constitutional: Negative for fever.  HENT: Negative for congestion.   Eyes: Positive for redness.  Respiratory: Positive for shortness of breath.   Cardiovascular: Negative for chest pain.  Gastrointestinal: Negative for abdominal pain.  Musculoskeletal: Negative for back pain.  Skin: Negative for color change.  Neurological: Negative for weakness.  Hematological: Negative for adenopathy.     Physical Exam Triage Vital Signs ED Triage Vitals  Enc Vitals Group     BP 10/18/18 1450 (!) 134/96     Pulse Rate 10/18/18 1450 92     Resp 10/18/18 1450 18     Temp 10/18/18 1450 98.3 F (36.8 C)     Temp Source 10/18/18 1450 Oral     SpO2 10/18/18 1450 100 %     Weight 10/18/18 1451 180 lb (81.6 kg)     Height --      Head Circumference --      Peak Flow --      Pain Score 10/18/18 1451 8     Pain Loc --  Pain Edu? --      Excl. in GC? --    No data found.  Updated Vital Signs BP (!) 134/96 (BP Location: Right Arm)   Pulse 92   Temp 98.3 F (36.8 C) (Oral)   Resp 18   Wt 81.6 kg   SpO2 100%   BMI 26.58 kg/m   Visual Acuity Right Eye Distance:   Left Eye Distance:   Bilateral Distance:    Right Eye Near:   Left Eye Near:    Bilateral Near:     Physical Exam Gen: NAD, alert, cooperative with exam,  ENT: normal lips, normal nasal mucosa,  Eye: normal EOM, bilateral injected sclera, no discharge CV:  no edema, +2 pedal pulses, regular rhythm Resp: no accessory muscle use, non-labored, clear to auscultation bilaterally Skin: no rashes, no areas of induration  Neuro: normal tone, normal sensation to touch Psych:  normal insight, alert and oriented MSK: normal gait, normal strength     UC Treatments / Results  Labs  (all labs ordered are listed, but only abnormal results are displayed) Labs Reviewed - No data to display  EKG   Radiology No results found.  Procedures Procedures (including critical care time)  Medications Ordered in UC Medications - No data to display  Initial Impression / Assessment and Plan / UC Course  I have reviewed the triage vital signs and the nursing notes.  Pertinent labs & imaging results that were available during my care of the patient were reviewed by me and considered in my medical decision making (see chart for details).     Jerilynn SomCalvin is a 53 year old male that is presenting with reported shortness of breath and bilateral conjunctivitis.  Spoke with ophthalmology and his conjunctivitis could be associated with post COVID.  Will prescribe TobraDex.  He looks well on exam with reassuring vital signs.  Advised that he can monitor his shortness of breath for now.  Does not appear to be in distress.  Counseled on monitoring his symptoms and given indications to seek immediate care.  If having any breathing issues would recommend being seen in the emergency department for the consideration of a CT scan.  Final Clinical Impressions(s) / UC Diagnoses   Final diagnoses:  Acute viral conjunctivitis of both eyes  Shortness of breath     Discharge Instructions     Please try the drops for the eyes.  Please monitor your symptoms.  Please follow up to the emergency department if your breathing is difficult.     ED Prescriptions    Medication Sig Dispense Auth. Provider   tobramycin-dexamethasone [facility](TOBRADEX) ophthalmic ointment Place 1 application into both eyes 3 (three) times daily. 3.5 g Myra RudeSchmitz, Brandell Maready E, MD     Controlled Substance Prescriptions  Controlled Substance Registry consulted? Not Applicable   Myra RudeSchmitz, Olivianna Higley E, MD 10/18/18 1550

## 2018-10-18 NOTE — Discharge Instructions (Signed)
Please try the drops for the eyes.  Please monitor your symptoms.  Please follow up to the emergency department if your breathing is difficult.

## 2018-10-18 NOTE — ED Triage Notes (Addendum)
Pt states he has a SOB. Pt states both of his eyes are red and painful. Pt states he has been tested for Covid and it was positive. This was 3 weeks ago.

## 2018-10-18 NOTE — Telephone Encounter (Signed)
Per pharmacist, pt cannot afford Tobradex oint. Requesting alternative.  V.O. Dr Raeford Razor: may change to Tobradex opth gtts: 1 gtt QID x 7 days, no refills.  Pharmacist verbalized understanding.

## 2018-10-21 ENCOUNTER — Other Ambulatory Visit: Payer: Self-pay

## 2018-10-21 ENCOUNTER — Ambulatory Visit (INDEPENDENT_AMBULATORY_CARE_PROVIDER_SITE_OTHER): Payer: HRSA Program | Admitting: Primary Care

## 2018-10-21 ENCOUNTER — Encounter (INDEPENDENT_AMBULATORY_CARE_PROVIDER_SITE_OTHER): Payer: Self-pay | Admitting: Primary Care

## 2018-10-21 DIAGNOSIS — U071 COVID-19: Secondary | ICD-10-CM

## 2018-10-21 DIAGNOSIS — Z09 Encounter for follow-up examination after completed treatment for conditions other than malignant neoplasm: Secondary | ICD-10-CM

## 2018-10-21 DIAGNOSIS — Z7689 Persons encountering health services in other specified circumstances: Secondary | ICD-10-CM

## 2018-10-21 NOTE — Progress Notes (Signed)
Virtual Visit via Telephone Note  I connected with Ricky Glass on 10/21/18 at  1:50 PM EDT by telephone and verified that I am speaking with the correct person using two identifiers.   I discussed the limitations, risks, security and privacy concerns of performing an evaluation and management service by telephone and the availability of in person appointments. I also discussed with the patient that there may be a patient responsible charge related to this service. The patient expressed understanding and agreed to proceed.   History of Present Illness: Mr. Ricky Glass is having a tele visit to establish care and hospital follow up.  He presented to the hospital via EMS for evaluation of shortness of breath and chest pain. Diagnosed with COVID-19 viral infection after being tested on October 01, 2018.  Initial symptoms were  lost his sense of of smell, taste, body aches, dyspnea and substernal chest pain only with exertion and  at home was 104 F.   Past Medical History:  Diagnosis Date  . Diabetes mellitus without complication ([place])   . Gout     Observations/Objective: Review of Systems  All other systems reviewed and are negative.  Assessment and Plan: Ricky Glass was seen today for hospitalization follow-up.  Diagnoses and all orders for this visit:  Encounter to establish care No PCP on file recently hospitalized with COVID, has a history of hyperglycemia and HTN. He will be schedule for a in person visit to evaluated co morbidities and labs.  Hospital discharge follow-up COVID-19  positive feels great . Patient stated he moved his grassed this morning and he feel a lot better than he has in a while.   COVID-19 virus infection Your test for COVID-19 was positive, meaning that you were infected with the novel coronavirus and could give the germ to others.  Please continue isolation at home, for at least 10 days since the start of your fever/cough/breathlessness and until you have had  3 consecutive days without fever (without taking a fever reducer) and with cough/breathlessness improving. Please continue good preventive care measures, including:  frequent hand-washing, avoid touching your face, cover coughs/sneezes, stay out of crowds and keep a 6 foot distance from others.  Recheck or go to the nearest hospital ED tent for re-assessment if fever/cough/breathlessness return.   Follow Up Instructions:    I discussed the assessment and treatment plan with the patient. The patient was provided an opportunity to ask questions and all were answered. The patient agreed with the plan and demonstrated an understanding of the instructions.   The patient was advised to call back or seek an in-person evaluation if the symptoms worsen or if the condition fails to improve as anticipated.  I provided 26 minutes of non-face-to-face time during this encounter. Includes reviewing medical records . Labs, and imaging   Ricky Perna, NP

## 2018-11-24 ENCOUNTER — Encounter (INDEPENDENT_AMBULATORY_CARE_PROVIDER_SITE_OTHER): Payer: Self-pay

## 2018-11-24 ENCOUNTER — Other Ambulatory Visit: Payer: Self-pay

## 2018-11-24 ENCOUNTER — Ambulatory Visit (INDEPENDENT_AMBULATORY_CARE_PROVIDER_SITE_OTHER): Payer: Self-pay | Admitting: Primary Care

## 2019-01-13 ENCOUNTER — Encounter (INDEPENDENT_AMBULATORY_CARE_PROVIDER_SITE_OTHER): Payer: Self-pay | Admitting: Primary Care

## 2019-01-13 ENCOUNTER — Ambulatory Visit (INDEPENDENT_AMBULATORY_CARE_PROVIDER_SITE_OTHER): Payer: Self-pay | Admitting: Primary Care

## 2019-01-13 ENCOUNTER — Other Ambulatory Visit: Payer: Self-pay

## 2019-01-13 VITALS — BP 147/106 | HR 84 | Temp 97.8°F | Resp 16 | Ht 68.5 in | Wt 214.0 lb

## 2019-01-13 DIAGNOSIS — K0889 Other specified disorders of teeth and supporting structures: Secondary | ICD-10-CM

## 2019-01-13 DIAGNOSIS — Z1211 Encounter for screening for malignant neoplasm of colon: Secondary | ICD-10-CM

## 2019-01-13 DIAGNOSIS — E119 Type 2 diabetes mellitus without complications: Secondary | ICD-10-CM

## 2019-01-13 DIAGNOSIS — Z23 Encounter for immunization: Secondary | ICD-10-CM

## 2019-01-13 DIAGNOSIS — R030 Elevated blood-pressure reading, without diagnosis of hypertension: Secondary | ICD-10-CM

## 2019-01-13 DIAGNOSIS — M2550 Pain in unspecified joint: Secondary | ICD-10-CM

## 2019-01-13 DIAGNOSIS — K219 Gastro-esophageal reflux disease without esophagitis: Secondary | ICD-10-CM

## 2019-01-13 LAB — POCT GLYCOSYLATED HEMOGLOBIN (HGB A1C): HbA1c, POC (controlled diabetic range): 7.7 % — AB (ref 0.0–7.0)

## 2019-01-13 MED ORDER — METFORMIN HCL 500 MG PO TABS: 500.0000 mg | ORAL_TABLET | Freq: Two times a day (BID) | ORAL | 3 refills | Status: DC

## 2019-01-13 MED ORDER — OMEPRAZOLE 20 MG PO CPDR: 20.0000 mg | DELAYED_RELEASE_CAPSULE | Freq: Every day | ORAL | 3 refills | Status: DC

## 2019-01-13 NOTE — Progress Notes (Signed)
This morning woke up with at tooth ache  More fatigue in the last few days that usual. Gout and back pain  Takes predinsone from previous prescription for the back pain and gout.

## 2019-01-13 NOTE — Progress Notes (Signed)
Established Patient Office Visit  Subjective:  Patient ID: Ricky Glass, male    DOB: 1965-06-24  Age: 53 y.o. MRN: [id number]  CC: No chief complaint on file.   HPI Ricky Glass presents for chronic back pain, diabetes, obesity and elevated blood pressure. 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 He has been self treating chronic back pain with tylenol and prednisone. He also has complaints of tooth pain.  Past Medical History:  Diagnosis Date  . Diabetes mellitus without complication (Ricky Glass)   . Gout     No past surgical history on file.  Family History  Problem Relation Age of Onset  . Stroke Mother     Social History   Socioeconomic History  . Marital status: Single    Spouse name: Not on file  . Number of children: Not on file  . Years of education: Not on file  . Highest education level: Not on file  Occupational History  . Not on file  Social Needs  . Financial resource strain: Not on file  . Food insecurity    Worry: Not on file    Inability: Not on file  . Transportation needs    Medical: Not on file    Non-medical: Not on file  Tobacco Use  . Smoking status: Never Smoker  . Smokeless tobacco: Never Used  Substance and Sexual Activity  . Alcohol use: No    Alcohol/week: 0.0 standard drinks  . Drug use: No  . Sexual activity: Not on file  Lifestyle  . Physical activity    Days per week: Not on file    Minutes per session: Not on file  . Stress: Not on file  Relationships  . Social Herbalist on phone: Not on file    Gets together: Not on file    Attends religious service: Not on file    Active member of club or organization: Not on file    Attends meetings of clubs or organizations: Not on file    Relationship status: Not on file  . Intimate partner violence    Fear of current or ex partner: Not on file    Emotionally abused: Not on file    Physically abused: Not on  file    Forced sexual activity: Not on file  Other Topics Concern  . Not on file  Social History Narrative  . Not on file    Outpatient Medications Prior to Visit  Medication Sig Dispense Refill  . acetaminophen (TYLENOL) 325 MG tablet Take 2 tablets (650 mg total) by mouth every 6 (six) hours as needed for mild pain or headache (fever >/= 101).    Marland Kitchen tobramycin-dexamethasone (TOBRADEX) ophthalmic ointment Place 1 application into both eyes 3 (three) times daily. (Patient not taking: Reported on 01/13/2019) 3.5 g 0   No facility-administered medications prior to visit.     Allergies  Allergen Reactions  . Aspirin     Childhood reaction    ROS Review of Systems  Musculoskeletal: Positive for arthralgias and back pain.  All other systems reviewed and are negative.     Objective:    Physical Exam  Constitutional: He is oriented to person, place, and time. He appears well-developed and well-nourished.  obesed  HENT:  Head: Normocephalic.  Eyes: EOM are normal.  Cardiovascular: Normal rate and regular rhythm.  Pulmonary/Chest: Effort normal and breath sounds normal.  Abdominal: Soft. Bowel sounds are normal. He exhibits distension.  Musculoskeletal: Normal range of motion.  Neurological: He is oriented to person, place, and time.  Skin: Skin is dry.  Psychiatric: He has a normal mood and affect.  Nursing note and vitals reviewed.   BP (!) 147/106 (BP Location: Left Arm, Patient Position: Sitting, Cuff Size: Large)   Pulse 84   Temp 97.8 F (36.6 C) (Oral)   Resp 16   Ht 5' 8.5" (1.74 m)   Wt 214 lb (97.1 kg)   SpO2 97%   BMI 32.07 kg/m  Wt Readings from Last 3 Encounters:  01/13/19 214 lb (97.1 kg)  10/18/18 180 lb (81.6 kg)  10/06/18 283 lb (128.4 kg)     Health Maintenance Due  Topic Date Due  . PNEUMOCOCCAL POLYSACCHARIDE VACCINE AGE 71-64 HIGH RISK  11/18/1969  . OPHTHALMOLOGY EXAM  11/18/1977  . URINE MICROALBUMIN  11/18/1977  . TETANUS/TDAP   11/19/1986  . COLONOSCOPY  11/18/2017    There are no preventive care reminders to display for this patient.  No results found for: TSH Lab Results  Component Value Date   WBC 14.0 (H) 10/08/2018   HGB 13.4 10/08/2018   HCT 43.0 10/08/2018   MCV 83.3 10/08/2018   PLT 238 10/08/2018   Lab Results  Component Value Date   NA 140 10/08/2018   K 4.6 10/08/2018   CO2 22 10/08/2018   GLUCOSE 157 (H) 10/08/2018   BUN 25 (H) 10/08/2018   CREATININE 1.29 (H) 10/08/2018   BILITOT 0.4 10/08/2018   ALKPHOS 47 10/08/2018   AST 20 10/08/2018   ALT 29 10/08/2018   PROT 7.1 10/08/2018   ALBUMIN 3.4 (L) 10/08/2018   CALCIUM 8.9 10/08/2018   ANIONGAP 14 10/08/2018   No results found for: CHOL No results found for: HDL No results found for: [facility] Lab Results  Component Value Date   TRIG 61 10/07/2018   No results found for: CHOLHDL Lab Results  Component Value Date   HGBA1C 7.7 (A) 01/13/2019      Assessment & Plan:  Diagnoses and all orders for this visit:  Type 2 diabetes mellitus without complication, without long-term current use of insulin (HCC) New/Previous diagnosis of Type 2 diabetes A1C 7.7 start on 500mg  twice daily re-eval in 2 week for GI tolerance. Discussed foods that are high in carbohydrates are the following rice, potatoes, breads, sugars, and pastas.  Reduction in the intake (eating) will assist in lowering your blood sugars. -     CBC with Differential -     Complete Metabolic Panel with GFR -     Lipid Panel -     Microalbumin, urine -     Ambulatory referral to Ophthalmology -     HgB A1c -     Discontinue: metFORMIN (GLUCOPHAGE) 500 MG tablet; Take 1 tablet (500 mg total) by mouth 2 (two) times daily with a meal. -     metFORMIN (GLUCOPHAGE) 500 MG tablet; Take 1 tablet (500 mg total) by mouth 2 (two) times daily with a meal.  Colon cancer screening Recommend at the age of 53 to start colon cancer screening -     Fecal occult blood,  imunochemical([facility]); Future   Elevated blood pressure reading without diagnosis of hypertension Patient is having a tele visit for blood pressure in 2 weeks to re-evaluate BP reading before initiating blood pressure medication. Discussed 130/80 is guidelines for dx of HTN- low-sodium diet,150 minutes of moderate intensity exercise  per week.  Gastroesophageal reflux disease without esophagitis Decrease intake of spicy foods, alcohol intake, (never smoke or drink) acidity foods and drinks -     omeprazole (PRILOSEC) 20 MG capsule; Take 1 capsule (20 mg total) by mouth daily.  Arthralgia, unspecified joint -     Rheumatoid Arthritis Profile  Need for immunization against influenza -     Flu Vaccine QUAD 36+ mos IM  Pain, dental Provide with [employer] [facility] [employer] number in [place]  Other orders -     omeprazole (PRILOSEC) 20 MG capsule; Take 1 capsule (20 mg total) by mouth daily.    Meds ordered this encounter  Medications  . omeprazole (PRILOSEC) 20 MG capsule    Sig: Take 1 capsule (20 mg total) by mouth daily.    Dispense:  30 capsule    Refill:  3  . DISCONTD: metFORMIN (GLUCOPHAGE) 500 MG tablet    Sig: Take 1 tablet (500 mg total) by mouth 2 (two) times daily with a meal.    Dispense:  180 tablet    Refill:  3  . metFORMIN (GLUCOPHAGE) 500 MG tablet    Sig: Take 1 tablet (500 mg total) by mouth 2 (two) times daily with a meal.    Dispense:  180 tablet    Refill:  3    Follow-up: Return in about 3 months (around 04/15/2019) for F/u DM.    Grayce Sessions, NP

## 2019-01-14 LAB — MICROALBUMIN, URINE: Microalbumin, Urine: <3 ug/mL

## 2019-01-15 LAB — CBC WITH DIFFERENTIAL/PLATELET
Basophils Absolute: 0.1 10*3/uL (ref 0.0–0.2)
Basos: 0 %
EOS (ABSOLUTE): 0 10*3/uL (ref 0.0–0.4)
Eos: 0 %
Hematocrit: 45.7 % (ref 37.5–51.0)
Hemoglobin: 14.9 g/dL (ref 13.0–17.7)
Immature Grans (Abs): 0.1 10*3/uL (ref 0.0–0.1)
Immature Granulocytes: 1 %
Lymphocytes Absolute: 3 10*3/uL (ref 0.7–3.1)
Lymphs: 19 %
MCH: 26.9 pg (ref 26.6–33.0)
MCHC: 32.6 g/dL (ref 31.5–35.7)
MCV: 83 fL (ref 79–97)
Monocytes Absolute: 0.9 10*3/uL (ref 0.1–0.9)
Monocytes: 6 %
Neutrophils Absolute: 12 10*3/uL — ABNORMAL HIGH (ref 1.4–7.0)
Neutrophils: 74 %
Platelets: 263 10*3/uL (ref 150–450)
RBC: 5.53 x10E6/uL (ref 4.14–5.80)
RDW: 13.4 % (ref 11.6–15.4)
WBC: 16.2 10*3/uL — ABNORMAL HIGH (ref 3.4–10.8)

## 2019-01-15 LAB — CMP14+EGFR
ALT: 19 IU/L (ref 0–44)
AST: 10 IU/L (ref 0–40)
Albumin/Globulin Ratio: 1.9 (ref 1.2–2.2)
Albumin: 4.5 g/dL (ref 3.8–4.9)
Alkaline Phosphatase: 76 IU/L (ref 39–117)
BUN/Creatinine Ratio: 13 (ref 9–20)
BUN: 20 mg/dL (ref 6–24)
Bilirubin Total: 0.5 mg/dL (ref 0.0–1.2)
CO2: 27 mmol/L (ref 20–29)
Calcium: 9.5 mg/dL (ref 8.7–10.2)
Chloride: 101 mmol/L (ref 96–106)
Creatinine, Ser: 1.51 mg/dL — ABNORMAL HIGH (ref 0.76–1.27)
GFR calc Af Amer: 61 mL/min/{1.73_m2} (ref >59)
GFR calc non Af Amer: 53 mL/min/{1.73_m2} — ABNORMAL LOW (ref >59)
Globulin, Total: 2.4 g/dL (ref 1.5–4.5)
Glucose: 146 mg/dL — ABNORMAL HIGH (ref 65–99)
Potassium: 4.8 mmol/L (ref 3.5–5.2)
Sodium: 139 mmol/L (ref 134–144)
Total Protein: 6.9 g/dL (ref 6.0–8.5)

## 2019-01-15 LAB — LIPID PANEL
Chol/HDL Ratio: 4.8 ratio (ref 0.0–5.0)
Cholesterol, Total: 203 mg/dL — ABNORMAL HIGH (ref 100–199)
HDL: 42 mg/dL (ref >39)
LDL Chol Calc (NIH): 143 mg/dL — ABNORMAL HIGH (ref 0–99)
Triglycerides: 98 mg/dL (ref 0–149)
VLDL Cholesterol Cal: 18 mg/dL (ref 5–40)

## 2019-01-15 LAB — RHEUMATOID ARTHRITIS PROFILE
Cyclic Citrullin Peptide Ab: 6 units (ref 0–19)
Rheumatoid fact SerPl-aCnc: <10 IU/mL (ref 0.0–13.9)

## 2019-01-27 ENCOUNTER — Encounter (INDEPENDENT_AMBULATORY_CARE_PROVIDER_SITE_OTHER): Payer: Self-pay | Admitting: Primary Care

## 2019-01-27 ENCOUNTER — Other Ambulatory Visit: Payer: Self-pay

## 2019-01-27 ENCOUNTER — Ambulatory Visit (INDEPENDENT_AMBULATORY_CARE_PROVIDER_SITE_OTHER): Payer: Self-pay | Admitting: Primary Care

## 2019-01-27 DIAGNOSIS — I10 Essential (primary) hypertension: Secondary | ICD-10-CM

## 2019-01-27 MED ORDER — LISINOPRIL 10 MG PO TABS: 10.0000 mg | ORAL_TABLET | Freq: Every day | ORAL | 1 refills | Status: DC

## 2019-01-27 NOTE — Progress Notes (Signed)
BP reading at 8:30 129/92 HR 108 Taken in left arm

## 2019-02-07 NOTE — Progress Notes (Signed)
Virtual Visit via Telephone Note  I connected with Ricky Glass on 02/07/19 at 10:30 AM EST by telephone and verified that I am speaking with the correct person using two identifiers.   I discussed the limitations, risks, security and privacy concerns of performing an evaluation and management service by telephone and the availability of in person appointments. I also discussed with the patient that there may be a patient responsible charge related to this service. The patient expressed understanding and agreed to proceed.   History of Present Illness: Ricky Glass is having a re-evaluation of blood pressure today 129/92 and hear rate 108. He denies shortness of breath, headaches, chest pain or lower extremity edema  Past Medical History:  Diagnosis Date  . Diabetes mellitus without complication ([place])   . Gout    Current Outpatient Medications on File Prior to Visit  Medication Sig Dispense Refill  . metFORMIN (GLUCOPHAGE) 500 MG tablet Take 1 tablet (500 mg total) by mouth 2 (two) times daily with a meal. 180 tablet 3  . omeprazole (PRILOSEC) 20 MG capsule Take 1 capsule (20 mg total) by mouth daily. (Patient not taking: Reported on 01/27/2019) 30 capsule 3   No current facility-administered medications on file prior to visit.   Observations/Objective: Review of Systems  All other systems reviewed and are negative.   Assessment and Plan: Ricky Glass was seen today for blood pressure check.  Diagnoses and all orders for this visit:  Essential hypertension Discussed blood pressure goal of less than 130/80, low-sodium, 150 minutes of moderate intensity exercise per week. Discussed medication prescribe lisinopril dual purpose maintain blood pressure and renal protection for diabetes. S/S of hypo/hyper tension  Other orders -    -     lisinopril (ZESTRIL) 10 MG tablet; Take 1 tablet (10 mg total) by mouth daily.    Follow Up Instructions:    I discussed the assessment and  treatment plan with the patient. The patient was provided an opportunity to ask questions and all were answered. The patient agreed with the plan and demonstrated an understanding of the instructions.   The patient was advised to call back or seek an in-person evaluation if the symptoms worsen or if the condition fails to improve as anticipated.  I provided 14 minutes of non-face-to-face time during this encounter.   Kerin Perna, NP

## 2019-02-17 ENCOUNTER — Other Ambulatory Visit (INDEPENDENT_AMBULATORY_CARE_PROVIDER_SITE_OTHER): Payer: Self-pay | Admitting: Primary Care

## 2019-02-17 MED ORDER — PRAVASTATIN SODIUM 40 MG PO TABS: 40.0000 mg | ORAL_TABLET | Freq: Every day | ORAL | 3 refills | Status: DC

## 2019-02-23 ENCOUNTER — Encounter (INDEPENDENT_AMBULATORY_CARE_PROVIDER_SITE_OTHER): Payer: Self-pay

## 2019-02-24 ENCOUNTER — Telehealth (INDEPENDENT_AMBULATORY_CARE_PROVIDER_SITE_OTHER): Payer: Self-pay | Admitting: Primary Care

## 2019-02-24 ENCOUNTER — Other Ambulatory Visit (INDEPENDENT_AMBULATORY_CARE_PROVIDER_SITE_OTHER): Payer: Self-pay | Admitting: Primary Care

## 2019-02-24 MED ORDER — IBUPROFEN 600 MG PO TABS: 600.0000 mg | ORAL_TABLET | Freq: Three times a day (TID) | ORAL | 0 refills | Status: DC | PRN

## 2019-02-24 NOTE — Telephone Encounter (Signed)
1) Medication(s) Requested (by name): -ibuprofen (ADVIL,MOTRIN) 800 MG tablet   2) Pharmacy of Choice: -[place] ([place]),  - [address]   3) Special Requests: -Pt states his leg is in a lot of pain, he went to urgent care earlier this year and this medication helped, please follow up

## 2019-03-30 ENCOUNTER — Ambulatory Visit (INDEPENDENT_AMBULATORY_CARE_PROVIDER_SITE_OTHER): Payer: Self-pay | Admitting: Primary Care

## 2019-04-15 ENCOUNTER — Other Ambulatory Visit: Payer: Self-pay

## 2019-04-15 ENCOUNTER — Ambulatory Visit (INDEPENDENT_AMBULATORY_CARE_PROVIDER_SITE_OTHER): Payer: Self-pay | Admitting: Primary Care

## 2019-04-15 ENCOUNTER — Encounter (INDEPENDENT_AMBULATORY_CARE_PROVIDER_SITE_OTHER): Payer: Self-pay | Admitting: Primary Care

## 2019-04-15 DIAGNOSIS — E119 Type 2 diabetes mellitus without complications: Secondary | ICD-10-CM

## 2019-04-15 DIAGNOSIS — M1000 Idiopathic gout, unspecified site: Secondary | ICD-10-CM

## 2019-04-15 DIAGNOSIS — I10 Essential (primary) hypertension: Secondary | ICD-10-CM

## 2019-04-15 MED ORDER — METFORMIN HCL 500 MG PO TABS: 500.0000 mg | ORAL_TABLET | Freq: Two times a day (BID) | ORAL | 3 refills | Status: DC

## 2019-04-15 MED ORDER — LISINOPRIL 20 MG PO TABS: 10.0000 mg | ORAL_TABLET | Freq: Every day | ORAL | 1 refills | Status: DC

## 2019-04-15 MED ORDER — ALLOPURINOL 100 MG PO TABS: 100.0000 mg | ORAL_TABLET | Freq: Every day | ORAL | 1 refills | Status: AC

## 2019-04-15 MED ORDER — PRAVASTATIN SODIUM 40 MG PO TABS: 40.0000 mg | ORAL_TABLET | Freq: Every day | ORAL | 3 refills | Status: AC

## 2019-04-15 NOTE — Progress Notes (Signed)
121/101 at 10:26 without medication

## 2019-04-15 NOTE — Progress Notes (Signed)
Virtual Visit via Telephone Note  I connected with Ricky Glass on 04/15/19 at 10:30 AM EST by telephone and verified that I am speaking with the correct person using two identifiers.   I discussed the limitations, risks, security and privacy concerns of performing an evaluation and management service by telephone and the availability of in person appointments. I also discussed with the patient that there may be a patient responsible charge related to this service. The patient expressed understanding and agreed to proceed.   History of Present Illness: Ricky Glass is having tele visit for blood pressure follow up reading 121/100 was without medication after medication was  142/84 heart rate 100. Unable to provide previous blood pressures. Tried to talk through finding a arrow button to review old readings none. He denies shortness of breath, headaches, chest pain or lower extremity edema.  Past Medical History:  Diagnosis Date  . Diabetes mellitus without complication (HCC)   . Gout    Current Outpatient Medications on File Prior to Visit  Medication Sig Dispense Refill  . ibuprofen (ADVIL) 600 MG tablet Take 1 tablet (600 mg total) by mouth every 8 (eight) hours as needed. 90 tablet 0  . omeprazole (PRILOSEC) 20 MG capsule Take 1 capsule (20 mg total) by mouth daily. (Patient not taking: Reported on 01/27/2019) 30 capsule 3   No current facility-administered medications on file prior to visit.  Lisinopril 10mg  daily  Amlodipine 10mg  daily  Pravastatin  40mg  daily  Metformin 500mg  twice daily   Observations/Objective: Review of Systems  Musculoskeletal: Positive for joint pain.  All other systems reviewed and are negative.  Assessment and Plan: Ricky Glass was seen today for diabetes and medication refill.  Diagnoses and all orders for this visit:  Essential hypertension Blood pressure not at goal 130/80  low-sodium, DASH diet, medication compliance, 150 minutes of moderate  intensity exercise per week. Increased lisinopril 20mg  daily  Type 2 diabetes mellitus without complication, without long-term current use of insulin (HCC) A1c goal of therapy </= 6.5 hemoglobin. Decrease foods that are high in carbohydrates are the following rice, potatoes, breads, sugars, and pastas.  Reduction in the intake (eating) will assist in lowering your blood sugars. -     metFORMIN (GLUCOPHAGE) 500 MG tablet; Take 1 tablet (500 mg total) by mouth 2 (two) times daily with a meal.  Idiopathic gout, unspecified chronicity, unspecified site Acute gout attacks will manage with allopurinol 100 mg discussed. Decrease purines does not seafood,  red meats  alcohol smoking, and obesity   Other orders -     lisinopril (ZESTRIL) 20 MG tablet; Take 0.5 tablets (10 mg total) by mouth daily. -     pravastatin (PRAVACHOL) 40 MG tablet; Take 1 tablet (40 mg total) by mouth daily. -     allopurinol (ZYLOPRIM) 100 MG tablet; Take 1 tablet (100 mg total) by mouth daily.    Follow Up Instructions:    I discussed the assessment and treatment plan with the patient. The patient was provided an opportunity to ask questions and all were answered. The patient agreed with the plan and demonstrated an understanding of the instructions.   The patient was advised to call back or seek an in-person evaluation if the symptoms worsen or if the condition fails to improve as anticipated.  I provided 12  minutes of non-face-to-face time during this encounter.   , NP

## 2019-05-10 ENCOUNTER — Telehealth (INDEPENDENT_AMBULATORY_CARE_PROVIDER_SITE_OTHER): Payer: Self-pay

## 2019-05-10 NOTE — Telephone Encounter (Signed)
Patient called to request predisone stating that this is the only medication that helps his gout. Patient states he is having a flare up and the allopurinol (ZYLOPRIM) 100 MG tablet and the  ibuprofen (ADVIL) 600 MG tablet  Are not helping his flare up.  Patient uses [facility] [address]   Please advice [phone]

## 2019-05-10 NOTE — Telephone Encounter (Signed)
Sent to PCP ?

## 2019-05-11 NOTE — Telephone Encounter (Signed)
Will treat for gout flare up with colchicine send to pharmacy

## 2019-05-12 ENCOUNTER — Other Ambulatory Visit (INDEPENDENT_AMBULATORY_CARE_PROVIDER_SITE_OTHER): Payer: Self-pay | Admitting: Primary Care

## 2019-05-12 MED ORDER — COLCHICINE 0.6 MG PO TABS: 0.6000 mg | ORAL_TABLET | Freq: Two times a day (BID) | ORAL | 0 refills | Status: DC

## 2019-05-12 NOTE — Telephone Encounter (Signed)
Colchicine not sent. Please send to patient pharmacy.

## 2019-05-24 ENCOUNTER — Telehealth (INDEPENDENT_AMBULATORY_CARE_PROVIDER_SITE_OTHER): Payer: Self-pay

## 2019-05-24 NOTE — Telephone Encounter (Signed)
Sent to PCP ?

## 2019-05-24 NOTE — Telephone Encounter (Signed)
Patient called to make a medication refill for   predisone   Patient uses   [facility]   Please advice  [phone]

## 2019-05-25 ENCOUNTER — Other Ambulatory Visit (INDEPENDENT_AMBULATORY_CARE_PROVIDER_SITE_OTHER): Payer: Self-pay | Admitting: Primary Care

## 2019-05-25 MED ORDER — PREDNISONE 10 MG PO TABS: 10.0000 mg | ORAL_TABLET | Freq: Every day | ORAL | 0 refills | Status: DC

## 2019-05-25 NOTE — Telephone Encounter (Signed)
Spoke with Mr. Ricky Glass initial guidelines for treatment of gout flare up have been unsuccessful. Patient the only treatment that works for him is prednisone. Sent in taper dose 21 tablets 10mg  6 day course. Explain to patient long term use of prednisone is not good and can cause bone loss he voices understanding

## 2019-05-25 NOTE — Telephone Encounter (Signed)
Patient called to inform he is having to use crutches, states he is not able to walk. Patient states he has been asking for prednisone for a month now and she has not filled medication nor gave a clear explanation on why she can not fill medication. Patient wanted to speak with PCP supervisor states that he feels she is not doing her job. Patient was given clinics phone number were PCP supervisor Newlin MD. Is located.

## 2019-05-25 NOTE — Telephone Encounter (Signed)
Patient called clinic to speak with DR.Newlin in regards to problem at [facility]. He was informed the MD is out on vacation and Research officer, political party is not in building at this time.

## 2019-05-25 NOTE — Telephone Encounter (Signed)
Sent to PCP ?

## 2019-08-16 ENCOUNTER — Other Ambulatory Visit (INDEPENDENT_AMBULATORY_CARE_PROVIDER_SITE_OTHER): Payer: Self-pay | Admitting: Primary Care

## 2019-08-16 NOTE — Telephone Encounter (Signed)
Sent to PCP ?

## 2019-09-23 ENCOUNTER — Other Ambulatory Visit (INDEPENDENT_AMBULATORY_CARE_PROVIDER_SITE_OTHER): Payer: Self-pay | Admitting: Primary Care

## 2019-09-23 MED ORDER — LISINOPRIL 20 MG PO TABS: 10.0000 mg | ORAL_TABLET | Freq: Every day | ORAL | 0 refills | Status: DC

## 2019-09-23 NOTE — Telephone Encounter (Addendum)
Pt stated he contacted his pharm over a month ago. Pt need a refill on lisinopril . [facility] [address] in [place]. Pt last seen michelle feb 2021

## 2019-11-19 ENCOUNTER — Other Ambulatory Visit (INDEPENDENT_AMBULATORY_CARE_PROVIDER_SITE_OTHER): Payer: Self-pay | Admitting: Primary Care

## 2019-12-09 ENCOUNTER — Other Ambulatory Visit: Payer: Self-pay

## 2019-12-09 ENCOUNTER — Emergency Department (HOSPITAL_BASED_OUTPATIENT_CLINIC_OR_DEPARTMENT_OTHER)
Admission: EM | Admit: 2019-12-09 | Discharge: 2019-12-09 | Disposition: A | Discharge status: Home / Self Care | Payer: Self-pay | Attending: Emergency Medicine | Admitting: Emergency Medicine

## 2019-12-09 ENCOUNTER — Encounter (HOSPITAL_BASED_OUTPATIENT_CLINIC_OR_DEPARTMENT_OTHER): Payer: Self-pay | Admitting: *Deleted

## 2019-12-09 ENCOUNTER — Emergency Department (HOSPITAL_BASED_OUTPATIENT_CLINIC_OR_DEPARTMENT_OTHER): Payer: Self-pay

## 2019-12-09 DIAGNOSIS — X500XXA Overexertion from strenuous movement or load, initial encounter: Secondary | ICD-10-CM | POA: Insufficient documentation

## 2019-12-09 DIAGNOSIS — S46211A Strain of muscle, fascia and tendon of other parts of biceps, right arm, initial encounter: Secondary | ICD-10-CM

## 2019-12-09 DIAGNOSIS — Z7984 Long term (current) use of oral hypoglycemic drugs: Secondary | ICD-10-CM | POA: Insufficient documentation

## 2019-12-09 DIAGNOSIS — S46201A Unspecified injury of muscle, fascia and tendon of other parts of biceps, right arm, initial encounter: Secondary | ICD-10-CM | POA: Insufficient documentation

## 2019-12-09 DIAGNOSIS — Z79899 Other long term (current) drug therapy: Secondary | ICD-10-CM | POA: Insufficient documentation

## 2019-12-09 DIAGNOSIS — E119 Type 2 diabetes mellitus without complications: Secondary | ICD-10-CM | POA: Insufficient documentation

## 2019-12-09 MED ORDER — METHOCARBAMOL 500 MG PO TABS: 500.0000 mg | ORAL_TABLET | Freq: Two times a day (BID) | ORAL | 0 refills | Status: DC

## 2019-12-09 MED ORDER — IBUPROFEN 600 MG PO TABS: 600.0000 mg | ORAL_TABLET | Freq: Four times a day (QID) | ORAL | 0 refills | Status: DC | PRN

## 2019-12-09 MED FILL — IBUPROFEN 600 MG TABLET: 600 | 7 days supply | Qty: 30 | Fill #0

## 2019-12-09 MED FILL — METHOCARBAMOL 500 MG TABS: 500 | 10 days supply | Qty: 20 | Fill #0

## 2019-12-09 NOTE — ED Provider Notes (Signed)
[facility] EMERGENCY DEPARTMENT Provider Note   CSN: [id number] Arrival date & time: 12/09/19  1209     History Chief Complaint  Patient presents with   Arm Injury    Ricky Glass is a 54 y.o. male.  HPI      Hawkins R Glass is a 54 y.o. male, with a history of DM, presenting to the ED with injury to the right arm that occurred this morning prior to arrival.  Patient states he was lifting a 65 inch TV when he felt a pop and pain in the right upper arm.  This was followed by swelling. Pain is throbbing and aching, minimal with flexion of the elbow, moderate with extension of the elbow. Denies numbness, other injuries, other pain.   Past Medical History:  Diagnosis Date   Diabetes mellitus without complication (HCC)    Gout     Patient Active Problem List   Diagnosis Date Noted   Pneumonia due to COVID-19 virus 10/07/2018   Type 2 diabetes mellitus (HCC) 10/07/2018    History reviewed. No pertinent surgical history.     Family History  Problem Relation Age of Onset   Stroke Mother     Social History   Tobacco Use   Smoking status: Never Smoker   Smokeless tobacco: Never Used  Vaping Use   Vaping Use: Never used  Substance Use Topics   Alcohol use: No    Alcohol/week: 0.0 standard drinks   Drug use: No    Home Medications Prior to Admission medications   Medication Sig Start Date End Date Taking? Authorizing Provider  allopurinol (ZYLOPRIM) 100 MG tablet Take 1 tablet (100 mg total) by mouth daily. 04/15/19   Grayce Sessions, NP  colchicine 0.6 MG tablet Take 1 tablet (0.6 mg total) by mouth 2 (two) times daily for 10 days. 05/12/19 05/22/19  Grayce Sessions, NP  ibuprofen (ADVIL) 600 MG tablet TAKE 1 TABLET BY MOUTH EVERY 8 HOURS AS NEEDED 11/20/19   Grayce Sessions, NP  ibuprofen (ADVIL) 600 MG tablet Take 1 tablet (600 mg total) by mouth every 6 (six) hours as needed. 12/09/19   Jeffree Cazeau C, PA-C  lisinopril  (ZESTRIL) 20 MG tablet Take 0.5 tablets (10 mg total) by mouth daily. 09/23/19   Grayce Sessions, NP  metFORMIN (GLUCOPHAGE) 500 MG tablet Take 1 tablet (500 mg total) by mouth 2 (two) times daily with a meal. 04/15/19   Grayce Sessions, NP  methocarbamol (ROBAXIN) 500 MG tablet Take 1 tablet (500 mg total) by mouth 2 (two) times daily. 12/09/19   Ceara Wrightson C, PA-C  omeprazole (PRILOSEC) 20 MG capsule Take 1 capsule (20 mg total) by mouth daily. Patient not taking: Reported on 01/27/2019 01/13/19   Grayce Sessions, NP  pravastatin (PRAVACHOL) 40 MG tablet Take 1 tablet (40 mg total) by mouth daily. 04/15/19   Grayce Sessions, NP  predniSONE (DELTASONE) 10 MG tablet Take 1 tablet (10 mg total) by mouth daily with breakfast. Take 6 tablets day 1 day:day  2 take  5 tablets  Day 4 take 4 tablets Day 3 take 3 tablets, day 2 take 2 tablets and last day take 1 tablet 05/25/19   Grayce Sessions, NP    Allergies    Aspirin  Review of Systems   Review of Systems  Musculoskeletal: Positive for arthralgias and joint swelling.  Neurological: Negative for numbness.    Physical Exam Updated Vital Signs BP Marland Kitchen)  145/106    Pulse 88    Temp 97.8 F (36.6 C) (Oral)    Resp 18    Ht 5\' 9"  (1.753 m)    Wt 91.6 kg    SpO2 100%    BMI 29.83 kg/m   Physical Exam Vitals and nursing note reviewed.  Constitutional:      General: He is not in acute distress.    Appearance: He is well-developed. He is not diaphoretic.  HENT:     Head: Normocephalic and atraumatic.  Eyes:     Conjunctiva/sclera: Conjunctivae normal.  Cardiovascular:     Rate and Rhythm: Normal rate and regular rhythm.     Pulses:          Radial pulses are 2+ on the right side.  Pulmonary:     Effort: Pulmonary effort is normal.  Musculoskeletal:     Cervical back: Neck supple.     Comments: Swelling and tenderness at the distal right upper arm, the region of the distal biceps. No swelling, tenderness, deformity, or  instability to the rest of the right upper extremity.  Skin:    General: Skin is warm and dry.     Capillary Refill: Capillary refill takes less than 2 seconds.     Coloration: Skin is not pale.  Neurological:     Mental Status: He is alert.     Comments: Sensation to light touch grossly intact in the right upper extremity. Grip strength equal bilaterally. Strength 5/5 in the cardinal directions of the bilateral wrists. Flexion and extension intact in the right elbow, strength 4/5 with flexion. No pain, tenderness, swelling, or difficulty with range of motion of the right shoulder.  Psychiatric:        Behavior: Behavior normal.     ED Results / Procedures / Treatments   Labs (all labs ordered are listed, but only abnormal results are displayed) Labs Reviewed - No data to display  EKG None  Radiology DG Humerus Right  Result Date: 12/09/2019 CLINICAL DATA:  Right humerus pain and swelling. Suspected biceps injury EXAM: RIGHT HUMERUS - 2+ VIEW COMPARISON:  None. FINDINGS: There is no evidence of fracture or other focal bone lesions. Soft tissues are unremarkable. IMPRESSION: Negative. Electronically Signed   By: 12/11/2019 D.O.   On: 12/09/2019 14:11    Procedures Procedures (including critical care time)  Medications Ordered in ED Medications - No data to display  ED Course  I have reviewed the triage vital signs and the nursing notes.  Pertinent labs & imaging results that were available during my care of the patient were reviewed by me and considered in my medical decision making (see chart for details).    MDM Rules/Calculators/A&P                          Patient presents with sudden onset of pain to the right biceps region.  Swelling evident on exam.  No neurologically linked deficit on exam. Presentation is suspicious for rupture of biceps tendon. Recommend orthopedic follow-up. The patient was given instructions for home care as well as return  precautions. Patient voices understanding of these instructions, accepts the plan, and is comfortable with discharge.  I reviewed and interpreted the patient's radiological studies.  Final Clinical Impression(s) / ED Diagnoses Final diagnoses:  Rupture of right biceps tendon, initial encounter    Rx / DC Orders ED Discharge Orders         Ordered  ibuprofen (ADVIL) 600 MG tablet  Every 6 hours PRN        12/09/19 1419    methocarbamol (ROBAXIN) 500 MG tablet  2 times daily        12/09/19 1419           Anselm Pancoast, PA-C 12/09/19 1428    Tilden Fossa, MD 12/09/19 1441

## 2019-12-09 NOTE — Discharge Instructions (Signed)
  You have been seen today for an arm injury. There were no acute abnormalities on the x-rays, including no sign of fracture or dislocation, however, there could be injuries to the soft tissues, such as the ligaments or tendons that are not seen on xrays.  In fact, we suspect there may be a rupture in one of the tendons. Antiinflammatory medications: Take 600 mg of ibuprofen every 6 hours or 440 mg (over the counter dose) to 500 mg (prescription dose) of naproxen every 12 hours for the next 3 days. After this time, these medications may be used as needed for pain. Take these medications with food to avoid upset stomach. Choose only one of these medications, do not take them together. Acetaminophen (generic for Tylenol): Should you continue to have additional pain while taking the ibuprofen or naproxen, you may add in acetaminophen as needed. Your daily total maximum amount of acetaminophen from all sources should be limited to 4000mg /day for persons without liver problems, or 2000mg /day for those with liver problems. Methocarbamol: Methocarbamol (generic for Robaxin) is a muscle relaxer and can help relieve stiff muscles or muscle spasms.  Do not drive or perform other dangerous activities while taking this medication as it can cause drowsiness as well as changes in reaction time and judgement. Ice: May apply ice to the area over the next 24 hours for 15 minutes at a time to reduce swelling. Elevation: Keep the extremity elevated as often as possible to reduce pain and inflammation. Support: Wear the sling for support and comfort. Wear this until pain resolves.  Exercises: Start by performing these exercises a few times a week, increasing the frequency until you are performing them twice daily.  Follow up: Follow-up with orthopedic specialist on this matter, ideally within the next 2 weeks.  Call to make an appointment. Return: Return to the ED for numbness, weakness, increasing pain, overall worsening  symptoms, loss of function, or if symptoms are not improving, you have tried to follow up with the orthopedic specialist, and have been unable to do so.  For prescription assistance, may try using prescription discount sites or apps, such as goodrx.com or Good Rx smart phone app.

## 2019-12-09 NOTE — ED Triage Notes (Signed)
He was picking up a heavy TV and felt a "pop" in his right upper arm.

## 2019-12-30 ENCOUNTER — Other Ambulatory Visit (INDEPENDENT_AMBULATORY_CARE_PROVIDER_SITE_OTHER): Payer: Self-pay | Admitting: Primary Care

## 2019-12-30 NOTE — Telephone Encounter (Signed)
   Notes to clinic Has two of these rx on the  current med list, this is the older of the two. Please assess. 

## 2019-12-30 NOTE — Telephone Encounter (Signed)
  Notes to clinic Has two of these rx on the  current med list, this is the older of the two. Please assess.

## 2020-01-03 NOTE — Telephone Encounter (Signed)
Sent to PCP to refill if appropriate.  

## 2020-04-22 ENCOUNTER — Other Ambulatory Visit (INDEPENDENT_AMBULATORY_CARE_PROVIDER_SITE_OTHER): Payer: Self-pay | Admitting: Primary Care

## 2020-04-22 NOTE — Telephone Encounter (Signed)
Sent pt MyChart message - needs appt- Routing med refill request to office to see if will fill a short term refill.

## 2020-04-30 ENCOUNTER — Encounter (HOSPITAL_BASED_OUTPATIENT_CLINIC_OR_DEPARTMENT_OTHER): Payer: Self-pay | Admitting: Emergency Medicine

## 2020-04-30 ENCOUNTER — Emergency Department (HOSPITAL_BASED_OUTPATIENT_CLINIC_OR_DEPARTMENT_OTHER): Payer: Self-pay

## 2020-04-30 ENCOUNTER — Other Ambulatory Visit: Payer: Self-pay

## 2020-04-30 ENCOUNTER — Emergency Department (HOSPITAL_BASED_OUTPATIENT_CLINIC_OR_DEPARTMENT_OTHER)
Admission: EM | Admit: 2020-04-30 | Discharge: 2020-04-30 | Disposition: A | Discharge status: Home / Self Care | Payer: Self-pay | Attending: Emergency Medicine | Admitting: Emergency Medicine

## 2020-04-30 DIAGNOSIS — M542 Cervicalgia: Secondary | ICD-10-CM | POA: Insufficient documentation

## 2020-04-30 DIAGNOSIS — E119 Type 2 diabetes mellitus without complications: Secondary | ICD-10-CM | POA: Insufficient documentation

## 2020-04-30 DIAGNOSIS — R61 Generalized hyperhidrosis: Secondary | ICD-10-CM | POA: Insufficient documentation

## 2020-04-30 DIAGNOSIS — M545 Low back pain, unspecified: Secondary | ICD-10-CM | POA: Insufficient documentation

## 2020-04-30 DIAGNOSIS — Z7984 Long term (current) use of oral hypoglycemic drugs: Secondary | ICD-10-CM | POA: Insufficient documentation

## 2020-04-30 DIAGNOSIS — M62838 Other muscle spasm: Secondary | ICD-10-CM

## 2020-04-30 DIAGNOSIS — R42 Dizziness and giddiness: Secondary | ICD-10-CM | POA: Insufficient documentation

## 2020-04-30 DIAGNOSIS — R0789 Other chest pain: Secondary | ICD-10-CM

## 2020-04-30 DIAGNOSIS — Z8616 Personal history of COVID-19: Secondary | ICD-10-CM | POA: Insufficient documentation

## 2020-04-30 DIAGNOSIS — R079 Chest pain, unspecified: Secondary | ICD-10-CM | POA: Insufficient documentation

## 2020-04-30 DIAGNOSIS — M546 Pain in thoracic spine: Secondary | ICD-10-CM | POA: Insufficient documentation

## 2020-04-30 LAB — BASIC METABOLIC PANEL
Anion gap: 9 (ref 5–15)
BUN: 20 mg/dL (ref 6–20)
CO2: 25 mmol/L (ref 22–32)
Calcium: 8.7 mg/dL — ABNORMAL LOW (ref 8.9–10.3)
Chloride: 102 mmol/L (ref 98–111)
Creatinine, Ser: 1.19 mg/dL (ref 0.61–1.24)
GFR, Estimated: >60 mL/min (ref >60)
Glucose, Bld: 271 mg/dL — ABNORMAL HIGH (ref 70–99)
Potassium: 4.3 mmol/L (ref 3.5–5.1)
Sodium: 136 mmol/L (ref 135–145)

## 2020-04-30 LAB — CBC
HCT: 43.3 % (ref 39.0–52.0)
Hemoglobin: 13.8 g/dL (ref 13.0–17.0)
MCH: 26.6 pg (ref 26.0–34.0)
MCHC: 31.9 g/dL (ref 30.0–36.0)
MCV: 83.6 fL (ref 80.0–100.0)
Platelets: 251 10*3/uL (ref 150–400)
RBC: 5.18 MIL/uL (ref 4.22–5.81)
RDW: 13.2 % (ref 11.5–15.5)
WBC: 11.9 10*3/uL — ABNORMAL HIGH (ref 4.0–10.5)
nRBC: 0 % (ref 0.0–0.2)

## 2020-04-30 LAB — TROPONIN I (HIGH SENSITIVITY)
Troponin I (High Sensitivity): 3 ng/L (ref <18)
Troponin I (High Sensitivity): 2 ng/L (ref <18)

## 2020-04-30 MED ORDER — METHOCARBAMOL 500 MG PO TABS: 500.0000 mg | ORAL_TABLET | Freq: Three times a day (TID) | ORAL | 0 refills | Status: DC | PRN

## 2020-04-30 MED ORDER — LIDOCAINE 5 % EX PTCH: 1.0000 | MEDICATED_PATCH | CUTANEOUS | 0 refills | Status: DC

## 2020-04-30 MED ORDER — IOHEXOL 350 MG/ML SOLN
100.0000 mL | Freq: Once | INTRAVENOUS | Status: AC | PRN
  Administered 2020-04-30: 100 mL via INTRAVENOUS

## 2020-04-30 NOTE — Discharge Instructions (Signed)
Take motrin for pain   Take robaxin for muscle spasms   Use lidocaine patch for your back   See your doctor   Return to ER if you have worse back pain, chest pain, trouble breathing

## 2020-04-30 NOTE — ED Notes (Signed)
AVS reviewed with patient, pt teaching was also provided on the two Rx by ED Provider as well. Opportunity for questions provided prior to dc to home

## 2020-04-30 NOTE — ED Triage Notes (Signed)
Reports right sided chest pain for the last two days that got more intense today.  Reports he was at work and became diaphoretic.  Feels "tight in the right arm and right chest".

## 2020-04-30 NOTE — ED Provider Notes (Signed)
[facility] EMERGENCY DEPARTMENT Provider Note   CSN: [id number] Arrival date & time: 04/30/20  1847     History Chief Complaint  Patient presents with  . Chest Pain    Ricky Glass is a 55 y.o. male hx of DM, previous COVID infection here with chest pain, diaphoresis. Patient has been having intermittent right sided chest pain for the last 2 days. It is right upper back radiate to the neck and scapula area. He states that he was at work today and had sudden onset of diaphoresis and intense R upper back and neck pain. Felt dizzy as well. Also has lower back pain as well. Denies any numbness or weakness or trouble speaking. No hx of CAD or stents.    The history is provided by the patient.       Past Medical History:  Diagnosis Date  . Diabetes mellitus without complication (HCC)   . Gout     Patient Active Problem List   Diagnosis Date Noted  . Pneumonia due to COVID-19 virus 10/07/2018  . Type 2 diabetes mellitus (HCC) 10/07/2018    History reviewed. No pertinent surgical history.     Family History  Problem Relation Age of Onset  . Stroke Mother     Social History   Tobacco Use  . Smoking status: Never Smoker  . Smokeless tobacco: Never Used  Vaping Use  . Vaping Use: Never used  Substance Use Topics  . Alcohol use: No    Alcohol/week: 0.0 standard drinks  . Drug use: No    Home Medications Prior to Admission medications   Medication Sig Start Date End Date Taking? Authorizing Provider  allopurinol (ZYLOPRIM) 100 MG tablet Take 1 tablet (100 mg total) by mouth daily. 04/15/19   Grayce Sessions, NP  colchicine 0.6 MG tablet Take 1 tablet (0.6 mg total) by mouth 2 (two) times daily for 10 days. 05/12/19 05/22/19  Grayce Sessions, NP  ibuprofen (ADVIL) 600 MG tablet TAKE 1 TABLET BY MOUTH EVERY 8 HOURS AS NEEDED 01/05/20   Grayce Sessions, NP  lisinopril (ZESTRIL) 20 MG tablet Take 0.5 tablets (10 mg total) by mouth daily. 09/23/19    Grayce Sessions, NP  metFORMIN (GLUCOPHAGE) 500 MG tablet Take 1 tablet (500 mg total) by mouth 2 (two) times daily with a meal. 04/15/19   Grayce Sessions, NP  methocarbamol (ROBAXIN) 500 MG tablet Take 1 tablet (500 mg total) by mouth 2 (two) times daily. 12/09/19   Joy, Shawn C, PA-C  omeprazole (PRILOSEC) 20 MG capsule Take 1 capsule (20 mg total) by mouth daily. Patient not taking: Reported on 01/27/2019 01/13/19   Grayce Sessions, NP  pravastatin (PRAVACHOL) 40 MG tablet Take 1 tablet (40 mg total) by mouth daily. 04/15/19   Grayce Sessions, NP  predniSONE (DELTASONE) 10 MG tablet Take 1 tablet (10 mg total) by mouth daily with breakfast. Take 6 tablets day 1 day:day  2 take  5 tablets  Day 4 take 4 tablets Day 3 take 3 tablets, day 2 take 2 tablets and last day take 1 tablet 05/25/19   Grayce Sessions, NP    Allergies    Aspirin  Review of Systems   Review of Systems  Cardiovascular: Positive for chest pain.  Musculoskeletal: Positive for back pain and neck pain.  All other systems reviewed and are negative.   Physical Exam Updated Vital Signs BP (!) 144/98   Pulse 80   Temp  98.1 F (36.7 C) (Oral)   Resp 19   Ht 5\' 9"  (1.753 m)   Wt 91.2 kg   SpO2 98%   BMI 29.68 kg/m   Physical Exam Vitals and nursing note reviewed.  Constitutional:      Comments: Slightly uncomfortable   HENT:     Head: Normocephalic.  Eyes:     Extraocular Movements: Extraocular movements intact.     Pupils: Pupils are equal, round, and reactive to light.  Neck:     Comments: Mild R paracervical tenderness  Cardiovascular:     Rate and Rhythm: Normal rate and regular rhythm.     Heart sounds: Normal heart sounds.  Pulmonary:     Effort: Pulmonary effort is normal.     Breath sounds: Normal breath sounds.  Abdominal:     General: Bowel sounds are normal.     Palpations: Abdomen is soft.  Musculoskeletal:        General: Normal range of motion.     Cervical back: Normal  range of motion and neck supple.     Comments: R scapula and trapezius tenderness. Good peripheral pulses on all extremities   Skin:    General: Skin is warm.     Capillary Refill: Capillary refill takes less than 2 seconds.  Neurological:     General: No focal deficit present.     Mental Status: He is alert and oriented to person, place, and time.     Comments: CN 2- 12 intact, nl strength and sensation throughout, nl gait.   Psychiatric:        Mood and Affect: Mood normal.     ED Results / Procedures / Treatments   Labs (all labs ordered are listed, but only abnormal results are displayed) Labs Reviewed  BASIC METABOLIC PANEL - Abnormal; Notable for the following components:      Result Value   Glucose, Bld 271 (*)    Calcium 8.7 (*)    All other components within normal limits  CBC - Abnormal; Notable for the following components:   WBC 11.9 (*)    All other components within normal limits  TROPONIN I (HIGH SENSITIVITY)  TROPONIN I (HIGH SENSITIVITY)    EKG EKG Interpretation  Date/Time:  Sunday April 30 2020 19:03:11 EST Ventricular Rate:  77 PR Interval:  126 QRS Duration: 82 QT Interval:  360 QTC Calculation: 407 R Axis:   57 Text Interpretation: Normal sinus rhythm Nonspecific T wave abnormality Abnormal ECG No significant change since last tracing Confirmed by [id number] [phone]) on 04/30/2020 8:45:47 PM   Radiology DG Chest 2 View  Result Date: 04/30/2020 CLINICAL DATA:  Chest pain for 2 days EXAM: CHEST - 2 VIEW COMPARISON:  10/08/2018 FINDINGS: The heart size and mediastinal contours are within normal limits. Both lungs are clear. The visualized skeletal structures are unremarkable. IMPRESSION: No active cardiopulmonary disease. Electronically Signed   By: 10/10/2018 M.D.   On: 04/30/2020 19:51   CT Cervical Spine Wo Contrast  Result Date: 04/30/2020 CLINICAL DATA:  Right-sided neck pain, no known injury, initial encounter EXAM: CT CERVICAL SPINE WITHOUT  CONTRAST TECHNIQUE: Multidetector CT imaging of the cervical spine was performed without intravenous contrast. Multiplanar CT image reconstructions were also generated. COMPARISON:  12/28/2014 FINDINGS: Alignment: Within normal limits. Skull base and vertebrae: 7 cervical segments are well visualized. Increase in osteophytic changes are seen at C3-4 and C4-5. Mild facet hypertrophic changes are noted. No acute fracture or acute facet abnormality  is seen. Soft tissues and spinal canal: Surrounding soft tissue structures show mild vascular calcification. No acute hematoma is seen. Upper chest: Visualized lung apices are within normal limits. Other: None IMPRESSION: Mild degenerative change increased in the interval from the prior plain film examination. No acute abnormality is noted. Electronically Signed   By: Alcide CleverMark  Lukens M.D.   On: 04/30/2020 21:29   CT Angio Chest/Abd/Pel for Dissection W and/or Wo Contrast  Result Date: 04/30/2020 CLINICAL DATA:  Chest and abdominal pain. Aortic dissection suspected. EXAM: CT ANGIOGRAPHY CHEST, ABDOMEN AND PELVIS TECHNIQUE: Non-contrast CT of the chest was initially obtained. Multidetector CT imaging through the chest, abdomen and pelvis was performed using the standard protocol during bolus administration of intravenous contrast. Multiplanar reconstructed images and MIPs were obtained and reviewed to evaluate the vascular anatomy. CONTRAST:  100mL OMNIPAQUE IOHEXOL 350 MG/ML SOLN COMPARISON:  Chest radiograph earlier today.  Chest CT 10/06/2018 FINDINGS: CTA CHEST FINDINGS Cardiovascular: No aortic hematoma on noncontrast exam. There is no aortic dissection, aneurysm, or evidence of acute aortic syndrome. The left vertebral artery arises directly from the thoracic aorta, variant arch anatomy. Branch vessels are patent. No central pulmonary embolus to the lobar level. Heart is upper normal in size. There is no pericardial effusion. Mediastinum/Nodes: No mediastinal or hilar  adenopathy. Decompressed esophagus. No visualized thyroid nodule. Lungs/Pleura: Previous bilateral airspace disease has essentially resolved. There is minimal subsegmental scarring in the lingula and right middle lobe. No pleural effusion or findings of pulmonary edema. No nodule or mass. The trachea and central bronchi are patent. Musculoskeletal: There are no acute or suspicious osseous abnormalities. Review of the MIP images confirms the above findings. CTA ABDOMEN AND PELVIS FINDINGS VASCULAR Aorta: Normal caliber aorta without aneurysm, dissection, vasculitis or significant stenosis. Celiac: Patent without evidence of aneurysm, dissection, vasculitis or significant stenosis. SMA: Patent without evidence of aneurysm, dissection, vasculitis or significant stenosis. Renals: Both renal arteries are patent without evidence of aneurysm, dissection, vasculitis, fibromuscular dysplasia or significant stenosis. IMA: Patent without evidence of aneurysm, dissection, vasculitis or significant stenosis. Inflow: Patent without evidence of aneurysm, dissection, vasculitis or significant stenosis. Veins: No obvious venous abnormality within the limitations of this arterial phase study. Review of the MIP images confirms the above findings. NON-VASCULAR Hepatobiliary: No focal hepatic abnormality on arterial phase imaging. The gallbladder is decompressed. No calcified gallstones. No biliary dilatation. Pancreas: No ductal dilatation or inflammation. Spleen: Normal in size and arterial enhancement. Adrenals/Urinary Tract: Normal adrenal glands. No hydronephrosis. No perinephric edema. No evidence of focal renal abnormality. Urinary bladder is unremarkable. Stomach/Bowel: The stomach is decompressed. Normal positioning of the duodenum and ligament of Treitz. Fecalization of small bowel contents without obstruction, wall thickening or inflammation. Appendix not confidently visualized, no appendicitis. Small to moderate volume of  colonic stool. No colonic wall thickening or pericolonic edema. Lymphatic: No enlarged lymph nodes in the abdomen or pelvis. Reproductive: Prostate is unremarkable. Other: No free air or ascites. Tiny fat containing umbilical hernia. No inguinal hernia. Musculoskeletal: Minimal retrolisthesis of L3 on L4 with vacuum phenomenon. There are no acute or suspicious osseous abnormalities. Review of the MIP images confirms the above findings. IMPRESSION: 1. No aortic dissection or acute aortic abnormality. No central pulmonary embolus. 2. No acute abnormality in the chest, abdomen, or pelvis. 3. Fecalization of small bowel contents can be seen with slow transit. Electronically Signed   By: Narda RutherfordMelanie  Sanford M.D.   On: 04/30/2020 21:35    Procedures Procedures   Medications Ordered in  ED Medications  iohexol (OMNIPAQUE) 350 MG/ML injection 100 mL (100 mLs Intravenous Contrast Given 04/30/20 2114)    ED Course  I have reviewed the triage vital signs and the nursing notes.  Pertinent labs & imaging results that were available during my care of the patient were reviewed by me and considered in my medical decision making (see chart for details).    MDM Rules/Calculators/A&P                         Rc R Glass is a 55 y.o. male here with chest pain, diaphoresis, R scapula pain, neck pain. Consider dissection but I think likely MSK pain. Will get dissection study, trop x 2.   9:54 PM Labs unremarkable. Trop neg x 2. Dissection study negative. Likely muscle spasms. Will dc home with NSAIDs, robaxin, lidocaine patch    Final Clinical Impression(s) / ED Diagnoses Final diagnoses:  None    Rx / DC Orders ED Discharge Orders    None       Charlynne Pander, MD 04/30/20 2154

## 2021-02-10 ENCOUNTER — Encounter (HOSPITAL_COMMUNITY): Payer: Self-pay | Admitting: *Deleted

## 2021-02-10 ENCOUNTER — Other Ambulatory Visit: Payer: Self-pay

## 2021-02-10 ENCOUNTER — Emergency Department (HOSPITAL_COMMUNITY): Payer: 59

## 2021-02-10 ENCOUNTER — Emergency Department (HOSPITAL_COMMUNITY)
Admission: EM | Admit: 2021-02-10 | Discharge: 2021-02-11 | Disposition: A | Discharge status: Home / Self Care | Payer: 59 | Attending: Emergency Medicine | Admitting: Emergency Medicine

## 2021-02-10 DIAGNOSIS — M5412 Radiculopathy, cervical region: Secondary | ICD-10-CM | POA: Diagnosis not present

## 2021-02-10 DIAGNOSIS — R202 Paresthesia of skin: Secondary | ICD-10-CM | POA: Insufficient documentation

## 2021-02-10 DIAGNOSIS — Z8616 Personal history of COVID-19: Secondary | ICD-10-CM | POA: Insufficient documentation

## 2021-02-10 DIAGNOSIS — I10 Essential (primary) hypertension: Secondary | ICD-10-CM | POA: Diagnosis not present

## 2021-02-10 DIAGNOSIS — E119 Type 2 diabetes mellitus without complications: Secondary | ICD-10-CM | POA: Insufficient documentation

## 2021-02-10 DIAGNOSIS — Z79899 Other long term (current) drug therapy: Secondary | ICD-10-CM | POA: Insufficient documentation

## 2021-02-10 DIAGNOSIS — M542 Cervicalgia: Secondary | ICD-10-CM | POA: Diagnosis present

## 2021-02-10 DIAGNOSIS — Z7984 Long term (current) use of oral hypoglycemic drugs: Secondary | ICD-10-CM | POA: Insufficient documentation

## 2021-02-10 DIAGNOSIS — R531 Weakness: Secondary | ICD-10-CM | POA: Diagnosis not present

## 2021-02-10 NOTE — ED Provider Notes (Signed)
Emergency Medicine Provider Triage Evaluation Note  Stanly R [place] , a 55 y.o. male  was evaluated in triage.  Pt complains of pain in left shoulder and neck   Review of Systems  Positive: Numbness left hand,  Negative: No injury  Physical Exam  BP (!) 149/81    Pulse 97    Temp 98.6 F (37 C) (Oral)    Resp 18    Ht 5\' 9"  (1.753 m)    Wt 91.2 kg    SpO2 98%    BMI 29.69 kg/m  Gen:   Awake, no distress   Resp:  Normal effort  MSK:   Moves extremities without difficulty  Other:    Medical Decision Making  Medically screening exam initiated at 9:26 PM.  Appropriate orders placed.  Beulah Matusek Clelia Croft was informed that the remainder of the evaluation will be completed by another provider, this initial triage assessment does not replace that evaluation, and the importance of remaining in the ED until their evaluation is complete.     [place] 02/10/21 2127    2128, MD 02/14/21 1327

## 2021-02-10 NOTE — ED Triage Notes (Signed)
The pt is c/o his lt arm painful and numb for 2 days he also has pain up into his neck  no known injuries

## 2021-02-11 ENCOUNTER — Emergency Department (HOSPITAL_COMMUNITY): Payer: 59

## 2021-02-11 LAB — CBC WITH DIFFERENTIAL/PLATELET
Abs Immature Granulocytes: 0.05 10*3/uL (ref 0.00–0.07)
Basophils Absolute: 0 10*3/uL (ref 0.0–0.1)
Basophils Relative: 0 %
Eosinophils Absolute: 0.2 10*3/uL (ref 0.0–0.5)
Eosinophils Relative: 2 %
HCT: 42.4 % (ref 39.0–52.0)
Hemoglobin: 13.2 g/dL (ref 13.0–17.0)
Immature Granulocytes: 1 %
Lymphocytes Relative: 25 %
Lymphs Abs: 2.4 10*3/uL (ref 0.7–4.0)
MCH: 26.3 pg (ref 26.0–34.0)
MCHC: 31.1 g/dL (ref 30.0–36.0)
MCV: 84.6 fL (ref 80.0–100.0)
Monocytes Absolute: 0.8 10*3/uL (ref 0.1–1.0)
Monocytes Relative: 8 %
Neutro Abs: 6.1 10*3/uL (ref 1.7–7.7)
Neutrophils Relative %: 64 %
Platelets: 189 10*3/uL (ref 150–400)
RBC: 5.01 MIL/uL (ref 4.22–5.81)
RDW: 14.8 % (ref 11.5–15.5)
WBC: 9.6 10*3/uL (ref 4.0–10.5)
nRBC: 0 % (ref 0.0–0.2)

## 2021-02-11 LAB — BASIC METABOLIC PANEL
Anion gap: 5 (ref 5–15)
BUN: 17 mg/dL (ref 6–20)
CO2: 28 mmol/L (ref 22–32)
Calcium: 8.9 mg/dL (ref 8.9–10.3)
Chloride: 105 mmol/L (ref 98–111)
Creatinine, Ser: 1.14 mg/dL (ref 0.61–1.24)
GFR, Estimated: >60 mL/min (ref >60)
Glucose, Bld: 108 mg/dL — ABNORMAL HIGH (ref 70–99)
Potassium: 4 mmol/L (ref 3.5–5.1)
Sodium: 138 mmol/L (ref 135–145)

## 2021-02-11 LAB — TROPONIN I (HIGH SENSITIVITY): Troponin I (High Sensitivity): 7 ng/L (ref <18)

## 2021-02-11 MED ORDER — METHYLPREDNISOLONE 4 MG PO TBPK: ORAL_TABLET | ORAL | 0 refills | Status: DC

## 2021-02-11 MED ORDER — LIDOCAINE 5 % EX PTCH
1.0000 | MEDICATED_PATCH | CUTANEOUS | Status: DC
  Administered 2021-02-11: 03:00:00 1 via TRANSDERMAL
  Filled 2021-02-11: qty 1

## 2021-02-11 MED ORDER — CYCLOBENZAPRINE HCL 10 MG PO TABS
10.0000 mg | ORAL_TABLET | Freq: Once | ORAL | Status: DC
  Filled 2021-02-11: qty 1

## 2021-02-11 MED ORDER — CYCLOBENZAPRINE HCL 10 MG PO TABS: 10.0000 mg | ORAL_TABLET | Freq: Two times a day (BID) | ORAL | 0 refills | Status: DC | PRN

## 2021-02-11 MED ORDER — LIDOCAINE 5 % EX PTCH: 1.0000 | MEDICATED_PATCH | CUTANEOUS | 0 refills | Status: DC

## 2021-02-11 NOTE — ED Provider Notes (Signed)
[facility] EMERGENCY DEPARTMENT Provider Note   CSN: [id number] Arrival date & time: 02/10/21  2050     History Chief Complaint  Patient presents with   Extremity Weakness    Ricky Glass is a 55 y.o. male.  The history is provided by the patient and medical records.  Extremity Weakness Ricky Glass is a 55 y.o. male who presents to the Emergency Department complaining of numbness.  He presents to the ED for evaluation of numbness/tingling to the left hand that comes and goes since yesterday.  He has associated pain in the upper left posterior shoulder/upper arm that spreads to his hand.  Pain has been constant for about a week.  Pain is tight/heavy.  Yesterday he developed left sided chest heaviness.  Sxs worse in the morning when he wakes up and at night.   No fever.  Has sob that started today.  Has diaphoresis starting today.  No cough, AP, N/V, leg edema.  Has some left posterior neck pain for 1 or more weeks. No recent injuries.    Takes ibuprofen, gives some relief.  Takes hydrocodone with some relief.   Has a hx/o DM takes metformin, HTN     Past Medical History:  Diagnosis Date   Diabetes mellitus without complication (HCC)    Gout     Patient Active Problem List   Diagnosis Date Noted   Pneumonia due to COVID-19 virus 10/07/2018   Type 2 diabetes mellitus (HCC) 10/07/2018    History reviewed. No pertinent surgical history.     Family History  Problem Relation Age of Onset   Stroke Mother     Social History   Tobacco Use   Smoking status: Never   Smokeless tobacco: Never  Vaping Use   Vaping Use: Never used  Substance Use Topics   Alcohol use: No    Alcohol/week: 0.0 standard drinks   Drug use: No    Home Medications Prior to Admission medications   Medication Sig Start Date End Date Taking? Authorizing Provider  cyclobenzaprine (FLEXERIL) 10 MG tablet Take 1 tablet (10 mg total) by mouth 2 (two) times daily as  needed for muscle spasms. 02/11/21  Yes Tilden Fossa, MD  lidocaine (LIDODERM) 5 % Place 1 patch onto the skin daily. Remove & Discard patch within 12 hours or as directed by MD 02/11/21  Yes Tilden Fossa, MD  methylPREDNISolone (MEDROL DOSEPAK) 4 MG TBPK tablet Take according to label instructions 02/11/21  Yes Tilden Fossa, MD  allopurinol (ZYLOPRIM) 100 MG tablet Take 1 tablet (100 mg total) by mouth daily. 04/15/19   Grayce Sessions, NP  colchicine 0.6 MG tablet Take 1 tablet (0.6 mg total) by mouth 2 (two) times daily for 10 days. 05/12/19 05/22/19  Grayce Sessions, NP  ibuprofen (ADVIL) 600 MG tablet TAKE 1 TABLET BY MOUTH EVERY 8 HOURS AS NEEDED 01/05/20   Grayce Sessions, NP  lisinopril (ZESTRIL) 20 MG tablet Take 0.5 tablets (10 mg total) by mouth daily. 09/23/19   Grayce Sessions, NP  metFORMIN (GLUCOPHAGE) 500 MG tablet Take 1 tablet (500 mg total) by mouth 2 (two) times daily with a meal. 04/15/19   Grayce Sessions, NP  methocarbamol (ROBAXIN) 500 MG tablet Take 1 tablet (500 mg total) by mouth every 8 (eight) hours as needed for muscle spasms. 04/30/20   Charlynne Pander, MD  omeprazole (PRILOSEC) 20 MG capsule Take 1 capsule (20 mg total) by mouth daily. Patient not  taking: Reported on 01/27/2019 01/13/19   Grayce Sessions, NP  pravastatin (PRAVACHOL) 40 MG tablet Take 1 tablet (40 mg total) by mouth daily. 04/15/19   Grayce Sessions, NP  predniSONE (DELTASONE) 10 MG tablet Take 1 tablet (10 mg total) by mouth daily with breakfast. Take 6 tablets day 1 day:day  2 take  5 tablets  Day 4 take 4 tablets Day 3 take 3 tablets, day 2 take 2 tablets and last day take 1 tablet 05/25/19   Grayce Sessions, NP    Allergies    Aspirin  Review of Systems   Review of Systems  Musculoskeletal:  Positive for extremity weakness.  All other systems reviewed and are negative.  Physical Exam Updated Vital Signs BP (!) 144/100    Pulse 77    Temp 98 F (36.7 C)  (Oral)    Resp 16    Ht 5\' 9"  (1.753 m)    Wt 91.2 kg    SpO2 99%    BMI 29.69 kg/m   Physical Exam Vitals and nursing note reviewed.  Constitutional:      Appearance: He is well-developed.  HENT:     Head: Normocephalic and atraumatic.  Cardiovascular:     Rate and Rhythm: Normal rate and regular rhythm.     Heart sounds: No murmur heard. Pulmonary:     Effort: Pulmonary effort is normal. No respiratory distress.     Breath sounds: Normal breath sounds.  Abdominal:     Palpations: Abdomen is soft.     Tenderness: There is no abdominal tenderness. There is no guarding or rebound.  Musculoskeletal:        General: No swelling or tenderness.     Comments: 2+ radial and DP pulses bilaterally  Skin:    General: Skin is warm and dry.  Neurological:     Mental Status: He is alert and oriented to person, place, and time.     Comments: 5/5 strength in RUE, BLE.  4+/5 grip strength in LUE.    Psychiatric:        Behavior: Behavior normal.    ED Results / Procedures / Treatments   Labs (all labs ordered are listed, but only abnormal results are displayed) Labs Reviewed  BASIC METABOLIC PANEL - Abnormal; Notable for the following components:      Result Value   Glucose, Bld 108 (*)    All other components within normal limits  CBC WITH DIFFERENTIAL/PLATELET  TROPONIN I (HIGH SENSITIVITY)    EKG EKG Interpretation  Date/Time:  Sunday February 11 2021 02:47:27 EST Ventricular Rate:  65 PR Interval:  131 QRS Duration: 88 QT Interval:  394 QTC Calculation: 410 R Axis:   73 Text Interpretation: Sinus rhythm Borderline T wave abnormalities Confirmed by [id number] [phone]) on 02/11/2021 2:52:36 AM  Radiology DG Chest 2 View  Result Date: 02/11/2021 CLINICAL DATA:  Chest pain. EXAM: CHEST - 2 VIEW COMPARISON:  April 30, 2020 FINDINGS: The heart size and mediastinal contours are within normal limits. Both lungs are clear. The visualized skeletal structures are unremarkable.  IMPRESSION: No active cardiopulmonary disease. Electronically Signed   By: May 02, 2020 M.D.   On: 02/11/2021 02:55   DG Cervical Spine Complete  Result Date: 02/10/2021 CLINICAL DATA:  Left arm pain and numbness for 2 days, initial encounter EXAM: CERVICAL SPINE - COMPLETE 4+ VIEW COMPARISON:  04/30/2020 CT FINDINGS: Seven cervical segments are well visualized. Osteophytic changes are noted from C3-C6, stable in appearance  from the prior exam. No alignment abnormality is noted. Associated neural foraminal changes are seen at C4-5 and C5-6 bilaterally slightly worse on the right than the left. No acute fracture is seen. The odontoid is within normal limits. No soft tissue abnormality is noted. IMPRESSION: Degenerative change as described without acute abnormality. Electronically Signed   By: Alcide Clever M.D.   On: 02/10/2021 21:58   MR Cervical Spine Wo Contrast  Result Date: 02/11/2021 CLINICAL DATA:  Myelopathy, acute, progressive; left upper extremity weakness EXAM: MRI CERVICAL SPINE WITHOUT CONTRAST TECHNIQUE: Multiplanar, multisequence MR imaging of the cervical spine was performed. No intravenous contrast was administered. COMPARISON:  None. FINDINGS: Alignment: Physiologic. Vertebrae: No acute fracture or suspicious osseous lesion. Endplate degenerative changes, most prominent at C3-C4. Cord: Normal signal and morphology. Posterior Fossa, vertebral arteries, paraspinal tissues: Negative. Disc levels: C2-C3: No significant disc bulge. Facet and uncovertebral hypertrophy. No spinal canal stenosis. Mild right-greater-than-left neural foraminal narrowing. C3-C4: Mild disc bulge. Uncovertebral and facet arthropathy. No spinal canal stenosis. Severe left neural foraminal narrowing. C4-C5: Mild disc bulge with superimposed right paracentral protrusion, which abuts the ventral spinal cord. Mild spinal canal stenosis. Facet and uncovertebral hypertrophy. Moderate right and severe left neural foraminal  narrowing. C5-C6: Mild disc bulge. Uncovertebral and facet arthropathy. No spinal canal stenosis. Severe right and moderate left neural foraminal narrowing. C6-C7: No significant disc bulge. No spinal canal stenosis. Mild facet and uncovertebral hypertrophy. Mild right neural foraminal narrowing. C7-T1: Mild disc bulge with superimposed left subarticular extrusion, which measures up to 6 x 12 x 11 mm (AP x TR x CC) (series 8, image 34 and series 5, image 10). This abuts and mildly deforms the ventral spinal cord. No actual spinal canal stenosis. Severe left neural foraminal narrowing, with likely compression of the left C8 nerve. Mild right neural foraminal narrowing. IMPRESSION: 1. C7-T1 large left subarticular disc extrusion, which abuts and mildly deforms the ventral spinal cord and causes severe left neural foraminal narrowing, with likely compression of the exiting left C8 nerve. There is also mild right neural foraminal narrowing at this level. 2. C4-C5 mild spinal canal stenosis with severe left and moderate right neural foraminal narrowing. 3. No other spinal canal stenosis. Severe neural foraminal narrowing is noted on the left at C3-C4 and on the right at C5-C6, with other less severe neural foraminal narrowing, as described above. Electronically Signed   By: Wiliam Ke M.D.   On: 02/11/2021 03:48   DG Shoulder Left  Result Date: 02/10/2021 CLINICAL DATA:  Left shoulder pain EXAM: LEFT SHOULDER - 2+ VIEW COMPARISON:  None. FINDINGS: There is no evidence of fracture or dislocation. There is no evidence of arthropathy or other focal bone abnormality. Soft tissues are unremarkable. IMPRESSION: Negative. Electronically Signed   By: Helyn Numbers M.D.   On: 02/10/2021 22:04    Procedures Procedures   Medications Ordered in ED Medications  lidocaine (LIDODERM) 5 % 1 patch (1 patch Transdermal Patch Applied 02/11/21 0247)  cyclobenzaprine (FLEXERIL) tablet 10 mg (10 mg Oral Not Given 02/11/21  0249)    ED Course  I have reviewed the triage vital signs and the nursing notes.  Pertinent labs & imaging results that were available during my care of the patient were reviewed by me and considered in my medical decision making (see chart for details).    MDM Rules/Calculators/A&P  Patient here for evaluation of 1 week of progressive left upper extremity pain, now numbness and weakness in the left hand.  He also complains of left-sided chest discomfort as well.  Pulses are symmetric but he does have some left upper extremity weakness on examination.  MRI obtained given this weakness that demonstrates spinal cord nerve compression.  Discussed with on-call provider for neurosurgery.  Plan to start steroid pack with neurosurgery follow-up as an outpatient.  Current presentation is not consistent with ACS, PE, dissection.  Discussed with patient findings of studies.    Final Clinical Impression(s) / ED Diagnoses Final diagnoses:  Cervical radiculopathy    Rx / DC Orders ED Discharge Orders          Ordered    methylPREDNISolone (MEDROL DOSEPAK) 4 MG TBPK tablet        02/11/21 0516    lidocaine (LIDODERM) 5 %  Every 24 hours        02/11/21 0516    cyclobenzaprine (FLEXERIL) 10 MG tablet  2 times daily PRN        02/11/21 0516             Tilden Fossa, MD 02/11/21 [phone]

## 2021-02-11 NOTE — Discharge Instructions (Signed)
Please call the neurosurgeon for follow-up.  Get rechecked if you have new or concerning symptoms.

## 2021-05-24 ENCOUNTER — Ambulatory Visit (INDEPENDENT_AMBULATORY_CARE_PROVIDER_SITE_OTHER): Payer: Self-pay

## 2021-05-24 NOTE — Telephone Encounter (Signed)
? ?  Chief Complaint: Low back pain, radiates down legs.Having to use a walker or cane to ambulate. ?Symptoms: Pain, weakness ?Frequency: Started 2-3 months ago ?Pertinent Negatives: Patient denies  ?Disposition: [] ED /[] Urgent Care (no appt availability in office) / [x] Appointment(In office/virtual)/ []  [place] [facility]/ [] Home Care/ [] Refused Recommended Disposition /[] [place] [facility]/ []  Follow-up with PCP ?Additional Notes:   ?Reason for Disposition ? Numbness in a leg or foot (i.e., loss of sensation) ? ?Answer Assessment - Initial Assessment Questions ?1. ONSET: "When did the pain begin?"  ?    2-3 MONTHS ago ?2. LOCATION: "Where does it hurt?" (upper, mid or lower back) ?    Low back ?3. SEVERITY: "How bad is the pain?"  (e.g., Scale 1-10; mild, moderate, or severe) ?  - MILD (1-3): doesn't interfere with normal activities  ?  - MODERATE (4-7): interferes with normal activities or awakens from sleep  ?  - SEVERE (8-10): excruciating pain, unable to do any normal activities  ?    10 ?4. PATTERN: "Is the pain constant?" (e.g., yes, no; constant, intermittent)  ?    Comes and goes ?5. RADIATION: "Does the pain shoot into your legs or elsewhere?" ?    Down legs ?6. CAUSE:  "What do you think is causing the back pain?"  ?    Maybe a pinched nerve ?7. BACK OVERUSE:  "Any recent lifting of heavy objects, strenuous work or exercise?" ?    No ?8. MEDICATIONS: "What have you taken so far for the pain?" (e.g., nothing, acetaminophen, NSAIDS) ?    Has tried patches and hydrocodone ?9. NEUROLOGIC SYMPTOMS: "Do you have any weakness, numbness, or problems with bowel/bladder control?" ?    No ?10. OTHER SYMPTOMS: "Do you have any other symptoms?" (e.g., fever, abdominal pain, burning with urination, blood in urine) ?      No ?11. PREGNANCY: "Is there any chance you are pregnant?" (e.g., yes, no; LMP) ?      N/a ? ?Protocols used: Back Pain-A-AH ? ?

## 2021-05-25 ENCOUNTER — Encounter (INDEPENDENT_AMBULATORY_CARE_PROVIDER_SITE_OTHER): Payer: Self-pay | Admitting: Primary Care

## 2021-05-25 ENCOUNTER — Ambulatory Visit (INDEPENDENT_AMBULATORY_CARE_PROVIDER_SITE_OTHER): Payer: Self-pay | Admitting: Primary Care

## 2021-05-25 VITALS — BP 138/92 | HR 81 | Temp 97.6°F | Ht 69.0 in | Wt 185.6 lb

## 2021-05-25 DIAGNOSIS — E1169 Type 2 diabetes mellitus with other specified complication: Secondary | ICD-10-CM

## 2021-05-25 DIAGNOSIS — M5010 Cervical disc disorder with radiculopathy, unspecified cervical region: Secondary | ICD-10-CM

## 2021-05-25 DIAGNOSIS — R29898 Other symptoms and signs involving the musculoskeletal system: Secondary | ICD-10-CM

## 2021-05-25 DIAGNOSIS — I10 Essential (primary) hypertension: Secondary | ICD-10-CM

## 2021-05-25 DIAGNOSIS — E119 Type 2 diabetes mellitus without complications: Secondary | ICD-10-CM

## 2021-05-25 DIAGNOSIS — M25529 Pain in unspecified elbow: Secondary | ICD-10-CM

## 2021-05-25 DIAGNOSIS — E785 Hyperlipidemia, unspecified: Secondary | ICD-10-CM

## 2021-05-25 DIAGNOSIS — M1000 Idiopathic gout, unspecified site: Secondary | ICD-10-CM

## 2021-05-25 LAB — POCT GLYCOSYLATED HEMOGLOBIN (HGB A1C): Hemoglobin A1C: 7 % — AB (ref 4.0–5.6)

## 2021-05-25 MED ORDER — AMLODIPINE BESYLATE 10 MG PO TABS: 10.0000 mg | ORAL_TABLET | Freq: Every day | ORAL | 1 refills | Status: AC

## 2021-05-25 MED ORDER — METFORMIN HCL ER 500 MG PO TB24: 500.0000 mg | ORAL_TABLET | Freq: Every day | ORAL | 1 refills | Status: AC

## 2021-05-25 NOTE — Progress Notes (Signed)
Back pain for 3 months  ?States about an hour for legs to move ?Has had swelling/numbness/tingling in hands  ?Not sleeping due to pain  ?

## 2021-05-25 NOTE — Progress Notes (Signed)
? [place] ? ?Ricky Glass, is a 56 y.o. male ? ?YYQ:[id number] ? ?[id number] ? ?DOB - [birth year] ? ?Chief Complaint  ?Patient presents with  ? Diabetes  ? Back Pain  ?    ? ?Subjective:  ? ?Ricky Glass is a 56 y.o. male here today for several problems. He is using a cane and crutches for mobility. Patient has No headache, No chest pain, No abdominal pain - No Nausea, No new weakness tingling or numbness, No Cough - shortness of breath. Back pain for 3 months  ?and about an hour for legs to move ,has  swelling/numbness/tingling in hands GOUT, ( took mom medicine hydrocodone, oxycodone this really helps) unable to sleep due to pain . ? ?No problems updated. ? ?ALLERGIES: ?Allergies  ?Allergen Reactions  ? Aspirin   ?  Childhood reaction  ? ? ?PAST MEDICAL HISTORY: ?Past Medical History:  ?Diagnosis Date  ? Diabetes mellitus without complication ([place])   ? Gout   ? ? ?MEDICATIONS AT HOME: ?Prior to Admission medications   ?Medication Sig Start Date End Date Taking? Authorizing Provider  ?allopurinol (ZYLOPRIM) 100 MG tablet Take 1 tablet (100 mg total) by mouth daily. ?Patient not taking: Reported on 05/25/2021 04/15/19   Kerin Perna, NP  ?colchicine 0.6 MG tablet Take 1 tablet (0.6 mg total) by mouth 2 (two) times daily for 10 days. 05/12/19 05/22/19  Kerin Perna, NP  ?ibuprofen (ADVIL) 600 MG tablet TAKE 1 TABLET BY MOUTH EVERY 8 HOURS AS NEEDED ?Patient not taking: Reported on 05/25/2021 01/05/20   Kerin Perna, NP  ?lisinopril (ZESTRIL) 20 MG tablet Take 0.5 tablets (10 mg total) by mouth daily. ?Patient not taking: Reported on 05/25/2021 09/23/19   Kerin Perna, NP  ?metFORMIN (GLUCOPHAGE) 500 MG tablet Take 1 tablet (500 mg total) by mouth 2 (two) times daily with a meal. ?Patient not taking: Reported on 05/25/2021 04/15/19   Kerin Perna, NP  ?pravastatin (PRAVACHOL) 40 MG tablet Take 1 tablet (40 mg total) by mouth daily. ?Patient not taking: Reported on  05/25/2021 04/15/19   Kerin Perna, NP  ? ? ?Objective:  ? ?Vitals:  ? 05/25/21 1010  ?BP: (!) 138/92  ?Pulse: 81  ?Temp: 97.6 ?F (36.4 ?C)  ?TempSrc: Oral  ?SpO2: 98%  ?Weight: 185 lb 9.6 oz (84.2 kg)  ?Height: 5' 9" (1.753 m)  ? ?Exam ?General appearance : Awake, alert, not in any distress. Speech Clear. Not toxic looking ?HEENT: Atraumatic and Normocephalic, pupils equally reactive to light and accomodation ?Neck: Supple, no JVD. No cervical lymphadenopathy.  ?Chest: Good air entry bilaterally, no added sounds  ?CVS: S1 S2 regular, no murmurs.  ?Abdomen: Bowel sounds present, Non tender and not distended with no gaurding, rigidity or rebound. ?Extremities: B/L Lower Ext extremity weakness, both legs are warm to touch ?Neurology: Awake alert, and oriented X 3,  Non focal ?Skin: No Rash ? ?Data Review ?Lab Results  ?Component Value Date  ? HGBA1C 7.0 (A) 05/25/2021  ? HGBA1C 7.7 (A) 01/13/2019  ? HGBA1C 8.4 (H) 10/07/2018  ? ? ?Assessment & Plan  ? ?Finnley was seen today for diabetes and back pain. ? ?Diagnoses and all orders for this visit: ? ?Type 2 diabetes mellitus without complication, without long-term current use of insulin ([place]) ?-     HgB A1c 7.0 improved from 7.7 11/22- Continue to monitor foods that are high in carbohydrates are the following rice, potatoes, breads, sugars, and pastas.  Reduction in the  intake (eating) will assist in lowering your blood sugars.  ?-     CBC with Differential ?Changed to metFORMIN (GLUCOPHAGE-XR) 500 MG 24 hr tablet; Take 1 tablet (500 mg total) by mouth daily with breakfast. ? ?Cervical disc disorder with radiculopathy of cervical region ?IMPRESSION: ?1. C7-T1 large left subarticular disc extrusion, which abuts and ?mildly deforms the ventral spinal cord and causes severe left neural ?foraminal narrowing, with likely compression of the exiting left C8 ?nerve. There is also mild right neural foraminal narrowing at this ?level. ?2. C4-C5 mild spinal canal stenosis with  severe left and moderate ?right neural foraminal narrowing. ?3. No other spinal canal stenosis. Severe neural foraminal narrowing ?is noted on the left at C3-C4 and on the right at C5-C6, with other ?less severe neural foraminal narrowing, as described above ?-     AMB referral to orthopedics ?-     Ambulatory referral to Physical Therapy ?-     Ambulatory referral to Pain Clinic ? ?Weakness of both lower extremities ?-     Ambulatory referral to Physical Therapy ? ?Idiopathic gout, unspecified chronicity, unspecified site ?-     Uric Acid ?-     Ambulatory referral to Pain Clinic ? ?Hyperlipidemia associated with type 2 diabetes mellitus ([place]) ? Healthy lifestyle diet of fruits vegetables fish nuts whole grains and low saturated fat . Foods high in cholesterol or liver, fatty meats,cheese, butter avocados, nuts and seeds, chocolate and fried foods. ?  ?-     Lipid Panel ? ?Essential hypertension ?BP goal - < 130/80 ?Explained that having normal blood pressure is the goal and medications are helping to get to goal and maintain normal blood pressure. ?DIET: Limit salt intake, read nutrition labels to check salt content, limit fried and high fatty foods  ?Avoid using multisymptom OTC cold preparations that generally contain sudafed which can rise BP. Consult with pharmacist on best cold relief products to use for persons with HTN ?EXERCISE ?Discussed incorporating exercise such as walking - 30 minutes most days of the week and can do in 10 minute intervals    ?-     CMP14+EGFR ?-     amLODipine (NORVASC) 10 MG tablet; Take 1 tablet (10 mg total) by mouth daily. ? ?Arthralgia of upper arm, unspecified laterality ?-     Rheumatoid Arthritis Profile ?-     Ambulatory referral to Physical Therapy ?-     Ambulatory referral to Pain Clinic ? ?  ? ?Patient have been counseled extensively about nutrition and exercise. Other issues discussed during this visit include: low cholesterol diet, weight control and daily exercise,  foot care, annual eye examinations at Ophthalmology, importance of adherence with medications and regular follow-up. We also discussed long term complications of uncontrolled diabetes and hypertension.  ? ?Return in about 3 months (around 08/24/2021) for DM. ? ?The patient was given clear instructions to go to ER or return to medical center if symptoms don't improve, worsen or new problems develop. The patient verbalized understanding. The patient was told to call to get lab results if they haven't heard anything in the next week.  ? ?This note has been created with [employer], quantity. Any transcriptional errors are unintentional.  ? ?Kerin Perna, NP ?05/25/2021, 12:29 PM  ?

## 2021-05-25 NOTE — Patient Instructions (Signed)
Gout ?Gout is a condition that causes painful swelling of the joints. Gout is a type of inflammation of the joints (arthritis). This condition is caused by having too much uric acid in the body. Uric acid is a chemical that forms when the body breaks down substances called purines. Purines are important for building body proteins. ?When the body has too much uric acid, sharp crystals can form and build up inside the joints. This causes pain and swelling. Gout attacks can happen quickly and may be very painful (acute gout). Over time, the attacks can affect more joints and become more frequent (chronic gout). Gout can also cause uric acid to build up under the skin and inside the kidneys. ?What are the causes? ?This condition is caused by too much uric acid in your blood. This can happen because: ?Your kidneys do not remove enough uric acid from your blood. This is the most common cause. ?Your body makes too much uric acid. This can happen with some cancers and cancer treatments. It can also occur if your body is breaking down too many red blood cells (hemolytic anemia). ?You eat too many foods that are high in purines. These foods include organ meats and some seafood. Alcohol, especially beer, is also high in purines. ?A gout attack may be triggered by trauma or stress. ?What increases the risk? ?You are more likely to develop this condition if you: ?Have a family history of gout. ?Are male and middle-aged. ?Are male and have gone through menopause. ?Are obese. ?Frequently drink alcohol, especially beer. ?Are dehydrated. ?Lose weight too quickly. ?Have an organ transplant. ?Have lead poisoning. ?Take certain medicines, including aspirin, cyclosporine, diuretics, levodopa, and niacin. ?Have kidney disease. ?Have a skin condition called psoriasis. ?What are the signs or symptoms? ?An attack of acute gout happens quickly. It usually occurs in just one joint. The most common place is the big toe. Attacks often start  at night. Other joints that may be affected include joints of the feet, ankle, knee, fingers, wrist, or elbow. Symptoms of this condition may include: ?Severe pain. ?Warmth. ?Swelling. ?Stiffness. ?Tenderness. The affected joint may be very painful to touch. ?Shiny, red, or purple skin. ?Chills and fever. ?Chronic gout may cause symptoms more frequently. More joints may be involved. You may also have white or yellow lumps (tophi) on your hands or feet or in other areas near your joints. ?How is this diagnosed? ?This condition is diagnosed based on your symptoms, medical history, and physical exam. You may have tests, such as: ?Blood tests to measure uric acid levels. ?Removal of joint fluid with a thin needle (aspiration) to look for uric acid crystals. ?X-rays to look for joint damage. ?How is this treated? ?Treatment for this condition has two phases: treating an acute attack and preventing future attacks. Acute gout treatment may include medicines to reduce pain and swelling, including: ?NSAIDs. ?Steroids. These are strong anti-inflammatory medicines that can be taken by mouth (orally) or injected into a joint. ?Colchicine. This medicine relieves pain and swelling when it is taken soon after an attack. It can be given by mouth or through an IV. ?Preventive treatment may include: ?Daily use of smaller doses of NSAIDs or colchicine. ?Use of a medicine that reduces uric acid levels in your blood. ?Changes to your diet. You may need to see a dietitian about what to eat and drink to prevent gout. ?Follow these instructions at home: ?During a gout attack ? ?If directed, put ice on the affected area: ?  Put ice in a plastic bag. ?Place a towel between your skin and the bag. ?Leave the ice on for 20 minutes, 2-3 times a day. ?Raise (elevate) the affected joint above the level of your heart as often as possible. ?Rest the joint as much as possible. If the affected joint is in your leg, you may be given crutches to  use. ?Follow instructions from your health care provider about eating or drinking restrictions. ?Avoiding future gout attacks ?Follow a low-purine diet as told by your dietitian or health care provider. Avoid foods and drinks that are high in purines, including liver, kidney, anchovies, asparagus, herring, mushrooms, mussels, and beer. ?Maintain a healthy weight or lose weight if you are overweight. If you want to lose weight, talk with your health care provider. It is important that you do not lose weight too quickly. ?Start or maintain an exercise program as told by your health care provider. ?Eating and drinking ?Drink enough fluids to keep your urine pale yellow. ?If you drink alcohol: ?Limit how much you use to: ?0-1 drink a day for women. ?0-2 drinks a day for men. ?Be aware of how much alcohol is in your drink. In the [place] one drink equals one 12 oz bottle of beer (355 mL) one 5 oz glass of wine (148 mL), or one 1? oz glass of hard liquor (44 mL). ?General instructions ?Take over-the-counter and prescription medicines only as told by your health care provider. ?Do not drive or use heavy machinery while taking prescription pain medicine. ?Return to your normal activities as told by your health care provider. Ask your health care provider what activities are safe for you. ?Keep all follow-up visits as told by your health care provider. This is important. ?Contact a health care provider if you have: ?Another gout attack. ?Continuing symptoms of a gout attack after 10 days of treatment. ?Side effects from your medicines. ?Chills or a fever. ?Burning pain when you urinate. ?Pain in your lower back or belly. ?Get help right away if you: ?Have severe or uncontrolled pain. ?Cannot urinate. ?Summary ?Gout is painful swelling of the joints caused by inflammation. ?The most common site of pain is the big toe, but it can affect other joints in the body. ?Medicines and dietary changes can help to prevent and treat gout  attacks. ?This information is not intended to replace advice given to you by your health care provider. Make sure you discuss any questions you have with your health care provider. ?Document Revised: 08/22/2017 Document Reviewed: 09/03/2017 ?Elsevier Patient Education ? 2022 [employer] ? ?

## 2021-05-28 ENCOUNTER — Ambulatory Visit (INDEPENDENT_AMBULATORY_CARE_PROVIDER_SITE_OTHER): Payer: 59 | Admitting: Primary Care

## 2021-05-28 LAB — CBC WITH DIFFERENTIAL/PLATELET
Basophils Absolute: 0 10*3/uL (ref 0.0–0.2)
Basos: 0 %
EOS (ABSOLUTE): 0.1 10*3/uL (ref 0.0–0.4)
Eos: 1 %
Hematocrit: 38.6 % (ref 37.5–51.0)
Hemoglobin: 12.5 g/dL — ABNORMAL LOW (ref 13.0–17.7)
Immature Grans (Abs): 0 10*3/uL (ref 0.0–0.1)
Immature Granulocytes: 0 %
Lymphocytes Absolute: 1.8 10*3/uL (ref 0.7–3.1)
Lymphs: 17 %
MCH: 25.9 pg — ABNORMAL LOW (ref 26.6–33.0)
MCHC: 32.4 g/dL (ref 31.5–35.7)
MCV: 80 fL (ref 79–97)
Monocytes Absolute: 0.9 10*3/uL (ref 0.1–0.9)
Monocytes: 8 %
Neutrophils Absolute: 7.7 10*3/uL — ABNORMAL HIGH (ref 1.4–7.0)
Neutrophils: 74 %
Platelets: 260 10*3/uL (ref 150–450)
RBC: 4.82 x10E6/uL (ref 4.14–5.80)
RDW: 13.3 % (ref 11.6–15.4)
WBC: 10.6 10*3/uL (ref 3.4–10.8)

## 2021-05-28 LAB — CMP14+EGFR
ALT: 12 IU/L (ref 0–44)
AST: 14 IU/L (ref 0–40)
Albumin/Globulin Ratio: 1.2 (ref 1.2–2.2)
Albumin: 4 g/dL (ref 3.8–4.9)
Alkaline Phosphatase: 82 IU/L (ref 44–121)
BUN/Creatinine Ratio: 10 (ref 9–20)
BUN: 14 mg/dL (ref 6–24)
Bilirubin Total: 0.6 mg/dL (ref 0.0–1.2)
CO2: 22 mmol/L (ref 20–29)
Calcium: 9 mg/dL (ref 8.7–10.2)
Chloride: 99 mmol/L (ref 96–106)
Creatinine, Ser: 1.44 mg/dL — ABNORMAL HIGH (ref 0.76–1.27)
Globulin, Total: 3.3 g/dL (ref 1.5–4.5)
Glucose: 123 mg/dL — ABNORMAL HIGH (ref 70–99)
Potassium: 3.6 mmol/L (ref 3.5–5.2)
Sodium: 137 mmol/L (ref 134–144)
Total Protein: 7.3 g/dL (ref 6.0–8.5)
eGFR: 58 mL/min/{1.73_m2} — ABNORMAL LOW (ref >59)

## 2021-05-28 LAB — LIPID PANEL
Chol/HDL Ratio: 4.5 ratio (ref 0.0–5.0)
Cholesterol, Total: 174 mg/dL (ref 100–199)
HDL: 39 mg/dL — ABNORMAL LOW (ref >39)
LDL Chol Calc (NIH): 122 mg/dL — ABNORMAL HIGH (ref 0–99)
Triglycerides: 69 mg/dL (ref 0–149)
VLDL Cholesterol Cal: 13 mg/dL (ref 5–40)

## 2021-05-28 LAB — URIC ACID: Uric Acid: 8.2 mg/dL (ref 3.8–8.4)

## 2021-05-28 LAB — RHEUMATOID ARTHRITIS PROFILE
Cyclic Citrullin Peptide Ab: 5 units (ref 0–19)
Rheumatoid fact SerPl-aCnc: 12.2 IU/mL (ref <14.0)

## 2021-05-30 ENCOUNTER — Other Ambulatory Visit (INDEPENDENT_AMBULATORY_CARE_PROVIDER_SITE_OTHER): Payer: Self-pay | Admitting: Primary Care

## 2021-05-30 ENCOUNTER — Ambulatory Visit: Payer: 59 | Admitting: Orthopaedic Surgery

## 2021-05-30 ENCOUNTER — Ambulatory Visit (INDEPENDENT_AMBULATORY_CARE_PROVIDER_SITE_OTHER): Payer: Self-pay | Admitting: *Deleted

## 2021-05-30 DIAGNOSIS — G894 Chronic pain syndrome: Secondary | ICD-10-CM

## 2021-05-30 NOTE — Telephone Encounter (Signed)
Routed to PCP 

## 2021-05-30 NOTE — Telephone Encounter (Signed)
Called patient to inform referred to pain management  ?

## 2021-05-30 NOTE — Telephone Encounter (Signed)
Summary: pain- medication  ? Pt called in stating he was recently seen by PCP due to some issues he was having and some pain, pt was giving some pain medication but states he is still having some pain.   ?  ? ? ?Chief Complaint: ankle pain ?Symptoms: 8/10 ?Frequency: constant ?Pertinent Negatives: Patient denies injury ?Disposition: [] ED /[] Urgent Care (no appt availability in office) / [] Appointment(In office/virtual)/ []  [facility]/ [] Home Care/ [] Refused Recommended Disposition /[]  Mobile Bus/ [x]  Follow-up with PCP ?Additional Notes: Pt has appt with pain clinic but not until 4/14. He is asking for Ibuprofen and Prednisone this has helped in the past with swelling and pain. He thought after lab work last week was going to call him in prescriptions. Wants meds to get through next week until appt, he is having to miss work. ? ?Reason for Disposition ? Ankle pain ? ?Answer Assessment - Initial Assessment Questions ?1. ONSET: "When did the pain start?"  ?    3 months ?2. LOCATION: "Where is the pain located?"  ?    ankle ?3. PAIN: "How bad is the pain?"    (Scale 1-10; or mild, moderate, severe) ? - MILD (1-3): doesn't interfere with normal activities.  ? - MODERATE (4-7): interferes with normal activities (e.g., work or school) or awakens from sleep, limping.  ? - SEVERE (8-10): excruciating pain, unable to do any normal activities, unable to walk.  ?    8.5 ?4. WORK OR EXERCISE: "Has there been any recent work or exercise that involved this part of the body?"  ?    no ?5. CAUSE: "What do you think is causing the ankle pain?" ?    Unsure, had lab work ?6. OTHER SYMPTOMS: "Do you have any other symptoms?" (e.g., calf pain, rash, fever, swelling) swelling ?    7. PREGNANCY: "Is there any chance you are pregnant?" "When was your last menstrual period?" ?    na ? ?Protocols used: Ankle Pain-A-AH ? ?

## 2021-06-28 ENCOUNTER — Telehealth: Payer: Self-pay | Admitting: Physician Assistant

## 2021-06-28 NOTE — Telephone Encounter (Signed)
Scheduled appt per 5/3 referral. Pt is aware of appt date and time. Pt is aware to arrive 15 mins prior to appt time and to bring and updated insurance card. Pt is aware of appt location.   ?

## 2021-07-10 NOTE — Progress Notes (Addendum)
[facility] [phone]   Fax:([phone]  INITIAL CONSULT NOTE  Patient Care Team: Grayce Sessions, NP as PCP - General (Internal Medicine)  Hematological/Oncological History 1) Labs from PCP, Dr. Francisca December 06/08/2021: CRP 96 (H), WBC 19.3 (H), Hgb 12.2 (L), MCV 85, Plt 347  2) 07/11/2021: Establish care with [facility]  CHIEF COMPLAINTS/PURPOSE OF CONSULTATION:  Leukocytosis  HISTORY OF PRESENTING ILLNESS:  Ricky Glass 56 y.o. male with medical history significant for diabetes and gout presents to the clinic for evaluation for leukocytosis.  He is unaccompanied for this visit.  On exam today, [name] reports that he is currently taking oral steroids for the last 2 to 3 months due to chronic back pain and episodes of gout.  He is trying to work with his pain management team to fill his pain medication that has not been filled due to insurance issues.  He takes prednisone 20 mg tablet daily.  His energy levels are fairly stable.  He is limited to do certain activities due to pain.  His appetite is good and he denies any recent weight loss.  He denies nausea, vomiting or abdominal pain.  His bowel habits are unchanged without any recurrent episodes of diarrhea or constipation.  He denies any infectious symptoms.  He denies fevers, chills, cough, shortness of breath, chest pain, sinusitis or urinary symptoms.  He has no other complaints.  A 10 point ROS is below.   MEDICAL HISTORY:  Past Medical History:  Diagnosis Date   Diabetes mellitus without complication (HCC)    Gout    Hyperlipidemia    Hypertension     SURGICAL HISTORY: History reviewed. No pertinent surgical history.  SOCIAL HISTORY: Social History   Socioeconomic History   Marital status: Single    Spouse name: Not on file   Number of children: Not on file   Years of education: Not on file   Highest education level: Not on file  Occupational History   Not on file   Tobacco Use   Smoking status: Never   Smokeless tobacco: Never  Vaping Use   Vaping Use: Never used  Substance and Sexual Activity   Alcohol use: No    Alcohol/week: 0.0 standard drinks   Drug use: No   Sexual activity: Not on file  Other Topics Concern   Not on file  Social History Narrative   Not on file   Social Determinants of Health   Financial Resource Strain: Not on file  Food Insecurity: Not on file  Transportation Needs: Not on file  Physical Activity: Not on file  Stress: Not on file  Social Connections: Not on file  Intimate Partner Violence: Not on file    FAMILY HISTORY: Family History  Problem Relation Age of Onset   Stroke Mother    Prostate cancer Father     ALLERGIES:  is allergic to aspirin.  MEDICATIONS:  Current Outpatient Medications  Medication Sig Dispense Refill   allopurinol (ZYLOPRIM) 100 MG tablet Take 1 tablet (100 mg total) by mouth daily. 90 tablet 1   amLODipine (NORVASC) 10 MG tablet Take 1 tablet (10 mg total) by mouth daily. 90 tablet 1   losartan (COZAAR) 50 MG tablet Take 50 mg by mouth daily.     metFORMIN (GLUCOPHAGE-XR) 500 MG 24 hr tablet Take 1 tablet (500 mg total) by mouth daily with breakfast. 90 tablet 1   pravastatin (PRAVACHOL) 40 MG tablet Take 1 tablet (40 mg total)  by mouth daily. 90 tablet 3   predniSONE (DELTASONE) 20 MG tablet Take by mouth.     colchicine 0.6 MG tablet Take 1 tablet (0.6 mg total) by mouth 2 (two) times daily for 10 days. 20 tablet 0   ibuprofen (ADVIL) 600 MG tablet TAKE 1 TABLET BY MOUTH EVERY 8 HOURS AS NEEDED (Patient not taking: Reported on 05/25/2021) 90 tablet 0   No current facility-administered medications for this visit.    REVIEW OF SYSTEMS:   Constitutional: ( - ) fevers, ( - )  chills , ( - ) night sweats Eyes: ( - ) blurriness of vision, ( - ) double vision, ( - ) watery eyes Ears, nose, mouth, throat, and face: ( - ) mucositis, ( - ) sore throat Respiratory: ( - ) cough, ( - )  dyspnea, ( - ) wheezes Cardiovascular: ( - ) palpitation, ( - ) chest discomfort, ( - ) lower extremity swelling Gastrointestinal:  ( - ) nausea, ( - ) heartburn, ( - ) change in bowel habits Skin: ( - ) abnormal skin rashes Lymphatics: ( - ) new lymphadenopathy, ( - ) easy bruising Neurological: ( - ) numbness, ( - ) tingling, ( - ) new weaknesses Behavioral/Psych: ( - ) mood change, ( - ) new changes  All other systems were reviewed with the patient and are negative.  PHYSICAL EXAMINATION: ECOG PERFORMANCE STATUS: 1 - Symptomatic but completely ambulatory  Vitals:   07/11/21 1109  BP: (!) 137/96  Pulse: 77  Resp: 16  Temp: (!) 97.5 F (36.4 C)  SpO2: 97%   Filed Weights   07/11/21 1109  Weight: 182 lb 6.4 oz (82.7 kg)    GENERAL: well appearing male in NAD  SKIN: skin color, texture, turgor are normal, no rashes or significant lesions EYES: conjunctiva are pink and non-injected, sclera clear OROPHARYNX: no exudate, no erythema; lips, buccal mucosa, and tongue normal  NECK: supple, non-tender LYMPH:  no palpable lymphadenopathy in the cervical or supraclavicular lymph nodes.  LUNGS: clear to auscultation and percussion with normal breathing effort HEART: regular rate & rhythm and no murmurs and no lower extremity edema ABDOMEN: soft, non-tender, non-distended, normal bowel sounds Musculoskeletal: no cyanosis of digits and no clubbing  PSYCH: alert & oriented x 3, fluent speech NEURO: no focal motor/sensory deficits  LABORATORY DATA:  I have reviewed the data as listed    Latest Ref Rng & Units 07/11/2021   12:23 PM 05/25/2021   10:50 AM 02/11/2021    2:35 AM  CBC  WBC 4.0 - 10.5 K/uL 12.5   10.6   9.6    Hemoglobin 13.0 - 17.0 g/dL 45.8   09.9   83.3    Hematocrit 39.0 - 52.0 % 37.4   38.6   42.4    Platelets 150 - 400 K/uL 220   260   189         Latest Ref Rng & Units 07/11/2021   12:23 PM 05/25/2021   10:50 AM 02/11/2021    2:35 AM  CMP  Glucose 70 - 99  mg/dL 825   053   976    BUN 6 - 20 mg/dL 20   14   17     Creatinine 0.61 - 1.24 mg/dL   7.34   1.93    Sodium 135 - 145 mmol/L 138   137   138    Potassium 3.5 - 5.1 mmol/L 4.3   3.6   4.0  Chloride 98 - 111 mmol/L 104   99   105    CO2 22 - 32 mmol/L 29   22   28     Calcium 8.9 - 10.3 mg/dL 9.0   9.0   8.9    Total Protein 6.5 - 8.1 g/dL 6.8   7.3     Total Bilirubin 0.3 - 1.2 mg/dL 0.5   0.6     Alkaline Phos 38 - 126 U/L 70   82     AST 15 - 41 U/L 10   14     ALT 0 - 44 U/L 15   12       ASSESSMENT & PLAN Ricky Glass is a 56 y.o. male who presents to the clinic for initial evaluation for leukocytosis.  I reviewed the possible etiologies for leukocytosis including infectious process, inflammatory process, medication induced and bone marrow disorders.  Review of patient's medications and medical history, likely underlying cause for leukocytosis is chronic intake of prednisone and intermittent episodes of gout flareups.  This is unlikely due to a bone marrow disorder.  Patient will proceed with serologic work-up to check CBC, CMP, sedimentation rate and C-reactive protein levels.  #Leukocytosis, neutrophil predominant: --Most consistent with medication induced with prednisone and due to periodic gout flare up --Labs today to check CBC, CMP, Sed rate and C-reactive protein levels --RTC in 3 months to monitor labs unless above workup requires further intervention.   Orders Placed This Encounter  Procedures   CBC with Differential (Cancer Center Only)    Standing Status:   Future    Number of Occurrences:   1    Standing Expiration Date:   07/12/2022   CMP (Cancer Center only)    Standing Status:   Future    Number of Occurrences:   1    Standing Expiration Date:   07/12/2022   Sedimentation rate    Standing Status:   Future    Number of Occurrences:   1    Standing Expiration Date:   07/11/2022   C-reactive protein    Standing Status:   Future    Number of Occurrences:    1    Standing Expiration Date:   07/11/2022    All questions were answered. The patient knows to call the clinic with any problems, questions or concerns.  I have spent a total of 60 minutes minutes of face-to-face and non-face-to-face time, preparing to see the patient, obtaining and/or reviewing separately obtained history, performing a medically appropriate examination, counseling and educating the patient, ordering tests, documenting clinical information in the electronic health record,  and care coordination.   Georga KaufmannIrene Alonnah Lampkins, PA-C Department [facility] Hematology/[facility]cology [facility] Phone: [phone]  Patient was seen with Dr.Dorsey  I have read the above note and personally examined the patient. I agree with the assessment and plan as noted above.  Briefly Ricky Glass is a 56 year old African-American male who presents for evaluation of leukocytosis.  The patient has a history of gout and is currently taking pulsed steroids.  On review it appears that his white blood cell count is 12.5 with neutrophilic predominance.  These findings are consistent with inflammation from gout and may also be exacerbated by the use of steroids.  We recommend rechecking his white blood cell count once his gout flare is resolved and he is no longer on steroid therapy in order to assure these have returned to normal levels.  We  have low concern for primary hematological process causing his current leukocytosis.   Ulysees Barns, MD Department of Hematology/Oncology [facility] at [facility] Phone: [phone] Pager: [phone] Email: [email or web address]

## 2021-07-11 ENCOUNTER — Other Ambulatory Visit: Payer: Self-pay

## 2021-07-11 ENCOUNTER — Inpatient Hospital Stay: Payer: 59 | Attending: Physician Assistant | Admitting: Physician Assistant

## 2021-07-11 ENCOUNTER — Encounter: Payer: Self-pay | Admitting: Physician Assistant

## 2021-07-11 ENCOUNTER — Inpatient Hospital Stay: Payer: 59

## 2021-07-11 VITALS — BP 137/96 | HR 77 | Temp 97.5°F | Resp 16 | Wt 182.4 lb

## 2021-07-11 DIAGNOSIS — I10 Essential (primary) hypertension: Secondary | ICD-10-CM | POA: Diagnosis not present

## 2021-07-11 DIAGNOSIS — D72828 Other elevated white blood cell count: Secondary | ICD-10-CM | POA: Diagnosis not present

## 2021-07-11 DIAGNOSIS — M109 Gout, unspecified: Secondary | ICD-10-CM | POA: Insufficient documentation

## 2021-07-11 DIAGNOSIS — Z7952 Long term (current) use of systemic steroids: Secondary | ICD-10-CM | POA: Insufficient documentation

## 2021-07-11 DIAGNOSIS — E119 Type 2 diabetes mellitus without complications: Secondary | ICD-10-CM | POA: Insufficient documentation

## 2021-07-11 DIAGNOSIS — Z79899 Other long term (current) drug therapy: Secondary | ICD-10-CM | POA: Insufficient documentation

## 2021-07-11 LAB — CBC WITH DIFFERENTIAL (CANCER CENTER ONLY)
Abs Immature Granulocytes: 0.16 10*3/uL — ABNORMAL HIGH (ref 0.00–0.07)
Basophils Absolute: 0 10*3/uL (ref 0.0–0.1)
Basophils Relative: 0 %
Eosinophils Absolute: 0 10*3/uL (ref 0.0–0.5)
Eosinophils Relative: 0 %
HCT: 37.4 % — ABNORMAL LOW (ref 39.0–52.0)
Hemoglobin: 11.8 g/dL — ABNORMAL LOW (ref 13.0–17.0)
Immature Granulocytes: 1 %
Lymphocytes Relative: 13 %
Lymphs Abs: 1.7 10*3/uL (ref 0.7–4.0)
MCH: 26.2 pg (ref 26.0–34.0)
MCHC: 31.6 g/dL (ref 30.0–36.0)
MCV: 82.9 fL (ref 80.0–100.0)
Monocytes Absolute: 0.5 10*3/uL (ref 0.1–1.0)
Monocytes Relative: 4 %
Neutro Abs: 10.2 10*3/uL — ABNORMAL HIGH (ref 1.7–7.7)
Neutrophils Relative %: 82 %
Platelet Count: 220 10*3/uL (ref 150–400)
RBC: 4.51 MIL/uL (ref 4.22–5.81)
RDW: 14.9 % (ref 11.5–15.5)
WBC Count: 12.5 10*3/uL — ABNORMAL HIGH (ref 4.0–10.5)
nRBC: 0.2 % (ref 0.0–0.2)

## 2021-07-11 LAB — CMP (CANCER CENTER ONLY)
ALT: 15 U/L (ref 0–44)
AST: 10 U/L — ABNORMAL LOW (ref 15–41)
Albumin: 3.5 g/dL (ref 3.5–5.0)
Alkaline Phosphatase: 70 U/L (ref 38–126)
Anion gap: 5 (ref 5–15)
BUN: 20 mg/dL (ref 6–20)
CO2: 29 mmol/L (ref 22–32)
Calcium: 9 mg/dL (ref 8.9–10.3)
Chloride: 104 mmol/L (ref 98–111)
Creatinine: 1.21 mg/dL (ref 0.61–1.24)
GFR, Estimated: >60 mL/min (ref >60)
Glucose, Bld: 183 mg/dL — ABNORMAL HIGH (ref 70–99)
Potassium: 4.3 mmol/L (ref 3.5–5.1)
Sodium: 138 mmol/L (ref 135–145)
Total Bilirubin: 0.5 mg/dL (ref 0.3–1.2)
Total Protein: 6.8 g/dL (ref 6.5–8.1)

## 2021-07-11 LAB — C-REACTIVE PROTEIN: CRP: 10.4 mg/dL — ABNORMAL HIGH (ref <1.0)

## 2021-07-11 LAB — SEDIMENTATION RATE: Sed Rate: 45 mm/hr — ABNORMAL HIGH (ref 0–16)

## 2021-07-13 ENCOUNTER — Telehealth: Payer: Self-pay

## 2021-07-13 ENCOUNTER — Telehealth: Payer: Self-pay | Admitting: Physician Assistant

## 2021-07-13 NOTE — Telephone Encounter (Signed)
Pt advised with VU 

## 2021-07-13 NOTE — Telephone Encounter (Signed)
Ricky Glass: Please call patient and let him know that WBC is improving. We will see him in 3 months to monitor closely

## 2021-07-13 NOTE — Telephone Encounter (Signed)
Scheduled per 5/17 los, pt has been called and confirmed

## 2021-08-27 ENCOUNTER — Ambulatory Visit (INDEPENDENT_AMBULATORY_CARE_PROVIDER_SITE_OTHER): Payer: 59 | Admitting: Primary Care

## 2021-09-04 ENCOUNTER — Ambulatory Visit (INDEPENDENT_AMBULATORY_CARE_PROVIDER_SITE_OTHER): Payer: 59 | Admitting: Primary Care

## 2021-10-18 ENCOUNTER — Inpatient Hospital Stay: Payer: 59 | Admitting: Hematology and Oncology

## 2021-10-18 ENCOUNTER — Other Ambulatory Visit: Payer: Self-pay | Admitting: Hematology and Oncology

## 2021-10-18 ENCOUNTER — Inpatient Hospital Stay: Payer: 59 | Attending: Hematology and Oncology

## 2021-10-18 DIAGNOSIS — D72828 Other elevated white blood cell count: Secondary | ICD-10-CM

## 2021-11-20 ENCOUNTER — Other Ambulatory Visit: Payer: Self-pay

## 2021-11-20 NOTE — Telephone Encounter (Signed)
Opened in error  .Whitni Pasquini R Brenae Lasecki, CMA  

## 2022-01-11 ENCOUNTER — Telehealth: Payer: Self-pay | Admitting: Physician Assistant

## 2022-01-11 NOTE — Telephone Encounter (Signed)
Contacted patient to scheduled appointments. Patient is aware of appointments that are scheduled.   

## 2022-01-15 ENCOUNTER — Inpatient Hospital Stay: Payer: 59 | Admitting: Physician Assistant

## 2022-01-15 ENCOUNTER — Inpatient Hospital Stay: Payer: 59

## 2022-01-30 ENCOUNTER — Inpatient Hospital Stay: Payer: 59 | Attending: Physician Assistant

## 2022-01-30 ENCOUNTER — Inpatient Hospital Stay: Payer: 59 | Admitting: Physician Assistant

## 2022-01-30 DIAGNOSIS — M129 Arthropathy, unspecified: Secondary | ICD-10-CM | POA: Diagnosis not present

## 2022-01-30 DIAGNOSIS — D72829 Elevated white blood cell count, unspecified: Secondary | ICD-10-CM | POA: Insufficient documentation

## 2022-01-30 DIAGNOSIS — M545 Low back pain, unspecified: Secondary | ICD-10-CM | POA: Insufficient documentation

## 2022-02-05 ENCOUNTER — Inpatient Hospital Stay: Payer: 59

## 2022-02-05 ENCOUNTER — Inpatient Hospital Stay (HOSPITAL_BASED_OUTPATIENT_CLINIC_OR_DEPARTMENT_OTHER): Payer: 59 | Admitting: Physician Assistant

## 2022-02-05 DIAGNOSIS — D72829 Elevated white blood cell count, unspecified: Secondary | ICD-10-CM | POA: Diagnosis not present

## 2022-02-05 DIAGNOSIS — D649 Anemia, unspecified: Secondary | ICD-10-CM | POA: Diagnosis not present

## 2022-02-05 DIAGNOSIS — M545 Low back pain, unspecified: Secondary | ICD-10-CM | POA: Diagnosis not present

## 2022-02-05 DIAGNOSIS — D72828 Other elevated white blood cell count: Secondary | ICD-10-CM

## 2022-02-05 LAB — CMP (CANCER CENTER ONLY)
ALT: 14 U/L (ref 0–44)
AST: 17 U/L (ref 15–41)
Albumin: 3.6 g/dL (ref 3.5–5.0)
Alkaline Phosphatase: 60 U/L (ref 38–126)
Anion gap: 5 (ref 5–15)
BUN: 13 mg/dL (ref 6–20)
CO2: 29 mmol/L (ref 22–32)
Calcium: 9.2 mg/dL (ref 8.9–10.3)
Chloride: 107 mmol/L (ref 98–111)
Creatinine: 1.27 mg/dL — ABNORMAL HIGH (ref 0.61–1.24)
GFR, Estimated: >60 mL/min (ref >60)
Glucose, Bld: 91 mg/dL (ref 70–99)
Potassium: 4 mmol/L (ref 3.5–5.1)
Sodium: 141 mmol/L (ref 135–145)
Total Bilirubin: 0.3 mg/dL (ref 0.3–1.2)
Total Protein: 7.1 g/dL (ref 6.5–8.1)

## 2022-02-05 LAB — CBC WITH DIFFERENTIAL (CANCER CENTER ONLY)
Abs Immature Granulocytes: 0.03 10*3/uL (ref 0.00–0.07)
Basophils Absolute: 0 10*3/uL (ref 0.0–0.1)
Basophils Relative: 1 %
Eosinophils Absolute: 0.2 10*3/uL (ref 0.0–0.5)
Eosinophils Relative: 4 %
HCT: 36.8 % — ABNORMAL LOW (ref 39.0–52.0)
Hemoglobin: 11.8 g/dL — ABNORMAL LOW (ref 13.0–17.0)
Immature Granulocytes: 1 %
Lymphocytes Relative: 36 %
Lymphs Abs: 2.4 10*3/uL (ref 0.7–4.0)
MCH: 26.4 pg (ref 26.0–34.0)
MCHC: 32.1 g/dL (ref 30.0–36.0)
MCV: 82.3 fL (ref 80.0–100.0)
Monocytes Absolute: 0.5 10*3/uL (ref 0.1–1.0)
Monocytes Relative: 8 %
Neutro Abs: 3.4 10*3/uL (ref 1.7–7.7)
Neutrophils Relative %: 50 %
Platelet Count: 298 10*3/uL (ref 150–400)
RBC: 4.47 MIL/uL (ref 4.22–5.81)
RDW: 13.5 % (ref 11.5–15.5)
WBC Count: 6.6 10*3/uL (ref 4.0–10.5)
nRBC: 0 % (ref 0.0–0.2)

## 2022-02-05 LAB — C-REACTIVE PROTEIN: CRP: 3.8 mg/dL — ABNORMAL HIGH (ref <1.0)

## 2022-02-05 LAB — SEDIMENTATION RATE: Sed Rate: 50 mm/hr — ABNORMAL HIGH (ref 0–16)

## 2022-02-06 NOTE — Progress Notes (Signed)
[facility] [phone]   Fax:([phone]  PROGRESS NOTE  Patient Care Team: Grayce Sessions, NP as PCP - General (Internal Medicine)  Hematological/Oncological History 1) Labs from PCP, Dr. Francisca December 06/08/2021: CRP 96 (H), WBC 19.3 (H), Hgb 12.2 (L), MCV 85, Plt 347  2) 07/11/2021: Establish care with [facility]  CHIEF COMPLAINTS/PURPOSE OF CONSULTATION:  Leukocytosis  HISTORY OF PRESENTING ILLNESS:  Ricky Glass 56 y.o. male returns for a follow up for leukocytosis. He is unaccompanied for this visit.   On exam today, Mr. Glass reports he continues to struggle with low back pain. He has established with a pain clinic and is on Norco with minimal relief. He reports the back pain can affect his ability to ambulate and he does use a walker as needed. His appetite is stable and he denies any noticeable weight loss. He denies nausea, vomiting or abdominal pain.  His bowel habits are unchanged without any recurrent episodes of diarrhea or constipation.  He denies any infectious symptoms.  He denies fevers, chills, sweats, cough, shortness of breath or chest pain.  He has no other complaints.  A 10 point ROS is below.   MEDICAL HISTORY:  Past Medical History:  Diagnosis Date   Diabetes mellitus without complication (HCC)    Gout    Hyperlipidemia    Hypertension     SURGICAL HISTORY: No past surgical history on file.  SOCIAL HISTORY: Social History   Socioeconomic History   Marital status: Single    Spouse name: Not on file   Number of children: Not on file   Years of education: Not on file   Highest education level: Not on file  Occupational History   Not on file  Tobacco Use   Smoking status: Never   Smokeless tobacco: Never  Vaping Use   Vaping Use: Never used  Substance and Sexual Activity   Alcohol use: No    Alcohol/week: 0.0 standard drinks of alcohol   Drug use: No   Sexual activity: Not on file  Other Topics  Concern   Not on file  Social History Narrative   Not on file   Social Determinants of Health   Financial Resource Strain: Not on file  Food Insecurity: Not on file  Transportation Needs: Not on file  Physical Activity: Not on file  Stress: Not on file  Social Connections: Not on file  Intimate Partner Violence: Not on file    FAMILY HISTORY: Family History  Problem Relation Age of Onset   Stroke Mother    Prostate cancer Father     ALLERGIES:  is allergic to aspirin.  MEDICATIONS:  Current Outpatient Medications  Medication Sig Dispense Refill   allopurinol (ZYLOPRIM) 100 MG tablet Take 1 tablet (100 mg total) by mouth daily. 90 tablet 1   amLODipine (NORVASC) 10 MG tablet Take 1 tablet (10 mg total) by mouth daily. 90 tablet 1   HYDROcodone-acetaminophen (NORCO/VICODIN) 5-325 MG tablet Take by mouth.     ibuprofen (ADVIL) 800 MG tablet Take 800 mg by mouth 2 (two) times daily.     losartan (COZAAR) 100 MG tablet Take 100 mg by mouth daily.     metFORMIN (GLUCOPHAGE-XR) 500 MG 24 hr tablet Take 1 tablet (500 mg total) by mouth daily with breakfast. 90 tablet 1   pravastatin (PRAVACHOL) 40 MG tablet Take 1 tablet (40 mg total) by mouth daily. 90 tablet 3   predniSONE (DELTASONE) 20 MG tablet Take by  mouth.     colchicine 0.6 MG tablet Take 1 tablet (0.6 mg total) by mouth 2 (two) times daily for 10 days. 20 tablet 0   losartan (COZAAR) 50 MG tablet Take 50 mg by mouth daily. (Patient not taking: Reported on 02/05/2022)     No current facility-administered medications for this visit.    REVIEW OF SYSTEMS:   Constitutional: ( - ) fevers, ( - )  chills , ( - ) night sweats Eyes: ( - ) blurriness of vision, ( - ) double vision, ( - ) watery eyes Ears, nose, mouth, throat, and face: ( - ) mucositis, ( - ) sore throat Respiratory: ( - ) cough, ( - ) dyspnea, ( - ) wheezes Cardiovascular: ( - ) palpitation, ( - ) chest discomfort, ( - ) lower extremity  swelling Gastrointestinal:  ( - ) nausea, ( - ) heartburn, ( - ) change in bowel habits Skin: ( - ) abnormal skin rashes Lymphatics: ( - ) new lymphadenopathy, ( - ) easy bruising Neurological: ( - ) numbness, ( - ) tingling, ( - ) new weaknesses Behavioral/Psych: ( - ) mood change, ( - ) new changes  All other systems were reviewed with the patient and are negative.  PHYSICAL EXAMINATION: ECOG PERFORMANCE STATUS: 1 - Symptomatic but completely ambulatory  There were no vitals filed for this visit.  There were no vitals filed for this visit.   GENERAL: well appearing male in NAD  SKIN: skin color, texture, turgor are normal, no rashes or significant lesions EYES: conjunctiva are pink and non-injected, sclera clear  LUNGS: clear to auscultation and percussion with normal breathing effort HEART: regular rate & rhythm and no murmurs and no lower extremity edema Musculoskeletal: no cyanosis of digits and no clubbing  PSYCH: alert & oriented x 3, fluent speech NEURO: no focal motor/sensory deficits  LABORATORY DATA:  I have reviewed the data as listed    Latest Ref Rng & Units 02/05/2022   11:43 AM 07/11/2021   12:23 PM 05/25/2021   10:50 AM  CBC  WBC 4.0 - 10.5 K/uL 6.6  12.5  10.6   Hemoglobin 13.0 - 17.0 g/dL 15.9  45.8  59.2   Hematocrit 39.0 - 52.0 % 36.8  37.4  38.6   Platelets 150 - 400 K/uL 298  220  260        Latest Ref Rng & Units 02/05/2022   11:43 AM 07/11/2021   12:23 PM 05/25/2021   10:50 AM  CMP  Glucose 70 - 99 mg/dL 91  924  462   BUN 6 - 20 mg/dL 13  20  14    Creatinine 0.61 - 1.24 mg/dL  8.63  8.17   Sodium 135 - 145 mmol/L 141  138  137   Potassium 3.5 - 5.1 mmol/L 4.0  4.3  3.6   Chloride 98 - 111 mmol/L 107  104  99   CO2 22 - 32 mmol/L 29  29  22    Calcium 8.9 - 10.3 mg/dL 9.2  9.0  9.0   Total Protein 6.5 - 8.1 g/dL 7.1  6.8  7.3   Total Bilirubin 0.3 - 1.2 mg/dL 0.3  0.5  0.6   Alkaline Phos 38 - 126 U/L 60  70  82   AST 15 - 41 U/L 17  10   14    ALT 0 - 44 U/L 14  15  12      ASSESSMENT & PLAN 7.11  Glass is a 56 y.o. male returns for a follow up for leukocytosis.   #Leukocytosis, neutrophil predominant: --Most consistent with medication induced with prednisone and due to periodic gout flare up --Patient is no longer taking prednisone at this time.  --Labs today show that leukocytosis has resolved with WBC 6.6. Inflammatory markers are still elevated with CRP 3.8, sed rate 50.  --Monitor for now.   #Normocytic anemia: --Hgb is 11.8 and MCV 82.3 today --Will check vitamin B12, folate and iron levels later this week.   Orders Placed This Encounter  Procedures   Ferritin    Standing Status:   Future    Standing Expiration Date:   02/06/2023   Iron and Iron Binding Capacity ([facility] only)    Standing Status:   Future    Standing Expiration Date:   02/06/2023   Folate, Serum    Standing Status:   Future    Standing Expiration Date:   02/05/2023   Vitamin B12    Standing Status:   Future    Standing Expiration Date:   02/05/2023    All questions were answered. The patient knows to call the clinic with any problems, questions or concerns.  I have spent a total of 25 minutes minutes of face-to-face and non-face-to-face time, preparing to see the patient, performing a medically appropriate examination, counseling and educating the patient, ordering tests, documenting clinical information in the electronic health record,  and care coordination.   Georga Kaufmann, PA-C Department of Hematology/Oncology [facility] Phone: [phone]

## 2022-02-08 ENCOUNTER — Telehealth: Payer: Self-pay | Admitting: Physician Assistant

## 2022-02-08 ENCOUNTER — Other Ambulatory Visit: Payer: Self-pay

## 2022-02-08 ENCOUNTER — Telehealth: Payer: Self-pay

## 2022-02-08 ENCOUNTER — Other Ambulatory Visit: Payer: Self-pay | Admitting: Physician Assistant

## 2022-02-08 ENCOUNTER — Inpatient Hospital Stay: Payer: 59

## 2022-02-08 DIAGNOSIS — D649 Anemia, unspecified: Secondary | ICD-10-CM

## 2022-02-08 DIAGNOSIS — D72829 Elevated white blood cell count, unspecified: Secondary | ICD-10-CM | POA: Diagnosis not present

## 2022-02-08 DIAGNOSIS — M545 Low back pain, unspecified: Secondary | ICD-10-CM | POA: Diagnosis not present

## 2022-02-08 LAB — IRON AND IRON BINDING CAPACITY (CC-WL,HP ONLY)
Iron: 53 ug/dL (ref 45–182)
Saturation Ratios: 21 % (ref 17.9–39.5)
TIBC: 251 ug/dL (ref 250–450)
UIBC: 198 ug/dL (ref 117–376)

## 2022-02-08 LAB — VITAMIN B12: Vitamin B-12: 178 pg/mL — ABNORMAL LOW (ref 180–914)

## 2022-02-08 LAB — FOLATE: Folate: 7.2 ng/mL (ref >5.9)

## 2022-02-08 LAB — FERRITIN: Ferritin: 95 ng/mL (ref 24–336)

## 2022-02-08 MED ORDER — VITAMIN B-12 1000 MCG PO TABS: 1000.0000 ug | ORAL_TABLET | Freq: Every day | ORAL | 3 refills | Status: AC

## 2022-02-08 NOTE — Telephone Encounter (Signed)
Ricky Glass: please notify patient he has vitamin B12 deficiency. I sent him vitamin B12 oral supplements to take once a day   Pt advised and voiced understanding

## 2022-02-08 NOTE — Telephone Encounter (Signed)
Called patient to schedule f/u. Patient notified.  

## 2022-05-08 ENCOUNTER — Encounter (INDEPENDENT_AMBULATORY_CARE_PROVIDER_SITE_OTHER): Payer: Self-pay

## 2022-05-10 ENCOUNTER — Other Ambulatory Visit: Payer: 59

## 2022-05-10 ENCOUNTER — Ambulatory Visit: Payer: 59 | Admitting: Physician Assistant

## 2022-05-10 ENCOUNTER — Other Ambulatory Visit: Payer: Self-pay | Admitting: Physician Assistant

## 2022-05-10 ENCOUNTER — Inpatient Hospital Stay: Payer: 59 | Attending: Physician Assistant

## 2022-05-10 ENCOUNTER — Inpatient Hospital Stay (HOSPITAL_BASED_OUTPATIENT_CLINIC_OR_DEPARTMENT_OTHER): Payer: 59 | Admitting: Physician Assistant

## 2022-05-10 VITALS — BP 150/98 | HR 105 | Temp 98.0°F | Resp 16 | Wt 178.1 lb

## 2022-05-10 DIAGNOSIS — D649 Anemia, unspecified: Secondary | ICD-10-CM

## 2022-05-10 DIAGNOSIS — I10 Essential (primary) hypertension: Secondary | ICD-10-CM | POA: Diagnosis not present

## 2022-05-10 DIAGNOSIS — L819 Disorder of pigmentation, unspecified: Secondary | ICD-10-CM

## 2022-05-10 DIAGNOSIS — D72829 Elevated white blood cell count, unspecified: Secondary | ICD-10-CM | POA: Diagnosis not present

## 2022-05-10 DIAGNOSIS — E119 Type 2 diabetes mellitus without complications: Secondary | ICD-10-CM | POA: Diagnosis not present

## 2022-05-10 NOTE — Progress Notes (Unsigned)
[facility] [phone]   Fax:([phone]) [phone]  PROGRESS NOTE  Patient Care Team: Kerin Perna, NP as PCP - General (Internal Medicine)  Hematological/Oncological History 1) Labs from PCP, Dr. Vira Browns 06/08/2021: CRP 96 (H), WBC 19.3 (H), Hgb 12.2 (L), MCV 85, Plt 347  2) 07/11/2021: Establish care with [facility]  CHIEF COMPLAINTS/PURPOSE OF CONSULTATION:  Leukocytosis  HISTORY OF PRESENTING ILLNESS:  Ricky Glass 57 y.o. male returns for a follow up for leukocytosis. He is unaccompanied for this visit.   On exam today, Mr. Glass reports he is doing well with respect to his energy levels. He does struggle with some ongoing shoulder pain. He is able to ambulate the shoulder and takes his prescribed pain medication. He has noticed dark hyperpigmented lesions on his face. He reports the skin lesions are not itchy and is uncertain of the etiology.   He denies fevers, chills, sweats, cough, shortness of breath, chest pain, nausea, vomiting, abdominal pain, diarrhea or constipation.  He has no other complaints.  A 10 point ROS is below.   MEDICAL HISTORY:  Past Medical History:  Diagnosis Date   Diabetes mellitus without complication ([place])    Gout    Hyperlipidemia    Hypertension     SURGICAL HISTORY: No past surgical history on file.  SOCIAL HISTORY: Social History   Socioeconomic History   Marital status: Single    Spouse name: Not on file   Number of children: Not on file   Years of education: Not on file   Highest education level: Not on file  Occupational History   Not on file  Tobacco Use   Smoking status: Never   Smokeless tobacco: Never  Vaping Use   Vaping Use: Never used  Substance and Sexual Activity   Alcohol use: No    Alcohol/week: 0.0 standard drinks of alcohol   Drug use: No   Sexual activity: Not on file  Other Topics Concern   Not on file  Social History Narrative   Not on file   Social  Determinants of Health   Financial Resource Strain: Not on file  Food Insecurity: Not on file  Transportation Needs: Not on file  Physical Activity: Not on file  Stress: Not on file  Social Connections: Not on file  Intimate Partner Violence: Not on file    FAMILY HISTORY: Family History  Problem Relation Age of Onset   Stroke Mother    Prostate cancer Father     ALLERGIES:  is allergic to aspirin.  MEDICATIONS:  Current Outpatient Medications  Medication Sig Dispense Refill   allopurinol (ZYLOPRIM) 100 MG tablet Take 1 tablet (100 mg total) by mouth daily. 90 tablet 1   amLODipine (NORVASC) 10 MG tablet Take 1 tablet (10 mg total) by mouth daily. 90 tablet 1   colchicine 0.6 MG tablet Take 1 tablet (0.6 mg total) by mouth 2 (two) times daily for 10 days. 20 tablet 0   cyanocobalamin (VITAMIN B12) 1000 MCG tablet Take 1 tablet (1,000 mcg total) by mouth daily. 30 tablet 3   HYDROcodone-acetaminophen (NORCO/VICODIN) 5-325 MG tablet Take by mouth.     ibuprofen (ADVIL) 800 MG tablet Take 800 mg by mouth 2 (two) times daily.     losartan (COZAAR) 100 MG tablet Take 100 mg by mouth daily.     metFORMIN (GLUCOPHAGE-XR) 500 MG 24 hr tablet Take 1 tablet (500 mg total) by mouth daily with breakfast. 90 tablet 1  pravastatin (PRAVACHOL) 40 MG tablet Take 1 tablet (40 mg total) by mouth daily. 90 tablet 3   predniSONE (DELTASONE) 20 MG tablet Take by mouth.     No current facility-administered medications for this visit.    REVIEW OF SYSTEMS:   Constitutional: ( - ) fevers, ( - )  chills , ( - ) night sweats Eyes: ( - ) blurriness of vision, ( - ) double vision, ( - ) watery eyes Ears, nose, mouth, throat, and face: ( - ) mucositis, ( - ) sore throat Respiratory: ( - ) cough, ( - ) dyspnea, ( - ) wheezes Cardiovascular: ( - ) palpitation, ( - ) chest discomfort, ( - ) lower extremity swelling Gastrointestinal:  ( - ) nausea, ( - ) heartburn, ( - ) change in bowel habits Skin: ( -  ) abnormal skin rashes Lymphatics: ( - ) new lymphadenopathy, ( - ) easy bruising Neurological: ( - ) numbness, ( - ) tingling, ( - ) new weaknesses Behavioral/Psych: ( - ) mood change, ( - ) new changes  All other systems were reviewed with the patient and are negative.  PHYSICAL EXAMINATION: ECOG PERFORMANCE STATUS: 1 - Symptomatic but completely ambulatory  Vitals:   05/10/22 1600  BP: (!) 150/98  Pulse: (!) 105  Resp: 16  Temp: 98 F (36.7 C)  SpO2: 98%    Filed Weights   05/10/22 1600  Weight: 178 lb 1.6 oz (80.8 kg)     GENERAL: well appearing male in NAD  SKIN: skin color, texture, turgor are normal. Dark hyperpigmentation on face/cheek EYES: conjunctiva are pink and non-injected, sclera clear  LUNGS: clear to auscultation and percussion with normal breathing effort HEART: regular rate & rhythm and no murmurs and no lower extremity edema Musculoskeletal: no cyanosis of digits and no clubbing  PSYCH: alert & oriented x 3, fluent speech NEURO: no focal motor/sensory deficits  LABORATORY DATA:  I have reviewed the data as listed    Latest Ref Rng & Units 02/05/2022   11:43 AM 07/11/2021   12:23 PM 05/25/2021   10:50 AM  CBC  WBC 4.0 - 10.5 K/uL 6.6  12.5  10.6   Hemoglobin 13.0 - 17.0 g/dL 11.8  11.8  12.5   Hematocrit 39.0 - 52.0 % 36.8  37.4  38.6   Platelets 150 - 400 K/uL 298  220  260        Latest Ref Rng & Units 02/05/2022   11:43 AM 07/11/2021   12:23 PM 05/25/2021   10:50 AM  CMP  Glucose 70 - 99 mg/dL 91  183  123   BUN 6 - 20 mg/dL 13  20  14    Creatinine 0.61 - 1.24 mg/dL 1.27  1.21  1.44   Sodium 135 - 145 mmol/L 141  138  137   Potassium 3.5 - 5.1 mmol/L 4.0  4.3  3.6   Chloride 98 - 111 mmol/L 107  104  99   CO2 22 - 32 mmol/L 29  29  22    Calcium 8.9 - 10.3 mg/dL 9.2  9.0  9.0   Total Protein 6.5 - 8.1 g/dL 7.1  6.8  7.3   Total Bilirubin 0.3 - 1.2 mg/dL 0.3  0.5  0.6   Alkaline Phos 38 - 126 U/L 60  70  82   AST 15 - 41 U/L 17  10  14     ALT 0 - 44 U/L 14  15  12  ASSESSMENT & PLAN Ricky Glass is a 57 y.o. male returns for a follow up for leukocytosis.   #Leukocytosis, neutrophil predominant: --Most consistent with medication induced with prednisone and due to periodic gout flare up --Patient is no longer taking prednisone at this time.  --Most recent labs from 02/05/22 showed that WBC was back to normal.  --Patient was unable to get labs today so he will proceed with repeat labs on Monday 05/13/2022.   #Normocytic anemia: --Currently on B12 supplementation --Will repeat labs on Monday, 05/13/2022.   #Hyperpigmentation on face: --Sent referral to dermatology for further evaluation  #Shoulder pain: --Encouraged patient to follow up with PCP     No orders of the defined types were placed in this encounter.   All questions were answered. The patient knows to call the clinic with any problems, questions or concerns.  I have spent a total of 25 minutes minutes of face-to-face and non-face-to-face time, preparing to see the patient, performing a medically appropriate examination, counseling and educating the patient, ordering tests, documenting clinical information in the electronic health record,  and care coordination.   Dede Query, PA-C Department of Hematology/Oncology [place] [facility] Phone: [phone]

## 2022-05-13 ENCOUNTER — Inpatient Hospital Stay: Payer: 59

## 2022-05-13 ENCOUNTER — Other Ambulatory Visit: Payer: Self-pay

## 2022-05-13 DIAGNOSIS — D72829 Elevated white blood cell count, unspecified: Secondary | ICD-10-CM | POA: Diagnosis not present

## 2022-05-13 DIAGNOSIS — E119 Type 2 diabetes mellitus without complications: Secondary | ICD-10-CM | POA: Diagnosis not present

## 2022-05-13 DIAGNOSIS — D649 Anemia, unspecified: Secondary | ICD-10-CM | POA: Diagnosis not present

## 2022-05-13 DIAGNOSIS — I10 Essential (primary) hypertension: Secondary | ICD-10-CM | POA: Diagnosis not present

## 2022-05-13 DIAGNOSIS — L819 Disorder of pigmentation, unspecified: Secondary | ICD-10-CM | POA: Diagnosis not present

## 2022-05-13 LAB — CMP (CANCER CENTER ONLY)
ALT: 19 U/L (ref 0–44)
AST: 14 U/L — ABNORMAL LOW (ref 15–41)
Albumin: 3.7 g/dL (ref 3.5–5.0)
Alkaline Phosphatase: 60 U/L (ref 38–126)
Anion gap: 7 (ref 5–15)
BUN: 23 mg/dL — ABNORMAL HIGH (ref 6–20)
CO2: 27 mmol/L (ref 22–32)
Calcium: 8.9 mg/dL (ref 8.9–10.3)
Chloride: 106 mmol/L (ref 98–111)
Creatinine: 1.3 mg/dL — ABNORMAL HIGH (ref 0.61–1.24)
GFR, Estimated: >60 mL/min (ref >60)
Glucose, Bld: 100 mg/dL — ABNORMAL HIGH (ref 70–99)
Potassium: 3.7 mmol/L (ref 3.5–5.1)
Sodium: 140 mmol/L (ref 135–145)
Total Bilirubin: 0.7 mg/dL (ref 0.3–1.2)
Total Protein: 7.2 g/dL (ref 6.5–8.1)

## 2022-05-13 LAB — CBC WITH DIFFERENTIAL (CANCER CENTER ONLY)
Abs Immature Granulocytes: 0.08 10*3/uL — ABNORMAL HIGH (ref 0.00–0.07)
Basophils Absolute: 0.1 10*3/uL (ref 0.0–0.1)
Basophils Relative: 0 %
Eosinophils Absolute: 0.1 10*3/uL (ref 0.0–0.5)
Eosinophils Relative: 0 %
HCT: 37.4 % — ABNORMAL LOW (ref 39.0–52.0)
Hemoglobin: 12.3 g/dL — ABNORMAL LOW (ref 13.0–17.0)
Immature Granulocytes: 1 %
Lymphocytes Relative: 19 %
Lymphs Abs: 2.8 10*3/uL (ref 0.7–4.0)
MCH: 26.4 pg (ref 26.0–34.0)
MCHC: 32.9 g/dL (ref 30.0–36.0)
MCV: 80.3 fL (ref 80.0–100.0)
Monocytes Absolute: 0.9 10*3/uL (ref 0.1–1.0)
Monocytes Relative: 6 %
Neutro Abs: 11 10*3/uL — ABNORMAL HIGH (ref 1.7–7.7)
Neutrophils Relative %: 74 %
Platelet Count: 241 10*3/uL (ref 150–400)
RBC: 4.66 MIL/uL (ref 4.22–5.81)
RDW: 15.3 % (ref 11.5–15.5)
WBC Count: 14.9 10*3/uL — ABNORMAL HIGH (ref 4.0–10.5)
nRBC: 0 % (ref 0.0–0.2)

## 2022-05-13 LAB — VITAMIN B12: Vitamin B-12: 446 pg/mL (ref 180–914)

## 2022-05-14 ENCOUNTER — Telehealth: Payer: Self-pay | Admitting: Physician Assistant

## 2022-05-14 NOTE — Telephone Encounter (Signed)
I called Ricky Glass to review the lab results from yesterday, 05/14/2022.  White blood cell count is elevated to 14.9 K/uL.  Patient adds that he restarted prednisone 20 mg twice a day approximately 2 weeks ago which explains leukocytosis.  His anemia has improved from 11.8-12.3 along with the vitamin B12 levels.  Advised patient to continue on B12 supplementation as prescribed.  Patient will return to clinic in 6 months with repeat labs.  Mr. Glass expressed understanding of the plan provided.

## 2022-07-19 ENCOUNTER — Ambulatory Visit (HOSPITAL_COMMUNITY)
Admission: EM | Admit: 2022-07-19 | Discharge: 2022-07-19 | Disposition: A | Discharge status: Home / Self Care | Payer: 59 | Attending: Emergency Medicine | Admitting: Emergency Medicine

## 2022-07-19 ENCOUNTER — Other Ambulatory Visit: Payer: Self-pay

## 2022-07-19 ENCOUNTER — Ambulatory Visit (INDEPENDENT_AMBULATORY_CARE_PROVIDER_SITE_OTHER): Payer: 59

## 2022-07-19 ENCOUNTER — Encounter (HOSPITAL_COMMUNITY): Payer: Self-pay | Admitting: Emergency Medicine

## 2022-07-19 DIAGNOSIS — S46912A Strain of unspecified muscle, fascia and tendon at shoulder and upper arm level, left arm, initial encounter: Secondary | ICD-10-CM

## 2022-07-19 DIAGNOSIS — M25512 Pain in left shoulder: Secondary | ICD-10-CM | POA: Diagnosis not present

## 2022-07-19 MED ORDER — METHYLPREDNISOLONE SODIUM SUCC 125 MG IJ SOLR
INTRAMUSCULAR | Status: AC
  Filled 2022-07-19: qty 2

## 2022-07-19 MED ORDER — KETOROLAC TROMETHAMINE 30 MG/ML IJ SOLN
30.0000 mg | Freq: Once | INTRAMUSCULAR | Status: AC
  Administered 2022-07-19: 30 mg via INTRAMUSCULAR

## 2022-07-19 MED ORDER — METHYLPREDNISOLONE SODIUM SUCC 125 MG IJ SOLR
80.0000 mg | Freq: Once | INTRAMUSCULAR | Status: AC
  Administered 2022-07-19: 80 mg via INTRAMUSCULAR

## 2022-07-19 MED ORDER — METHOCARBAMOL 500 MG PO TABS: 500.0000 mg | ORAL_TABLET | Freq: Two times a day (BID) | ORAL | 0 refills | Status: AC

## 2022-07-19 MED ORDER — KETOROLAC TROMETHAMINE 30 MG/ML IJ SOLN
INTRAMUSCULAR | Status: AC
  Filled 2022-07-19: qty 1

## 2022-07-19 NOTE — ED Provider Notes (Signed)
[facility]    CSN: [id number] Arrival date & time: 07/19/22  1719      History   Chief Complaint Chief Complaint  Patient presents with   Neck Pain    HPI Ricky Glass is a 57 y.o. male.   Patient presents to clinic for left neck and shoulder pain.  Has been ongoing for the past 2 weeks, but is has been gradually getting worse.  He notices a throbbing in his left arm when he lays flat.  Today work his left shoulder popped, was very painful and he felt like he was short of breath and felt a hard to speak in the moment.   Denies any history of respiratory issues, denies smoking or drug use.  Denies weakness. Denies chest pain.   Does have a history of osteoarthritis and cervical radiculopathy.   Took an ibuprofen yesterday.    The history is provided by the patient and medical records.  Neck Pain Associated symptoms: no fever and no weakness     Past Medical History:  Diagnosis Date   Diabetes mellitus without complication (HCC)    Gout    Hyperlipidemia    Hypertension     Patient Active Problem List   Diagnosis Date Noted   Pneumonia due to COVID-19 virus 10/07/2018   Type 2 diabetes mellitus (HCC) 10/07/2018    History reviewed. No pertinent surgical history.     Home Medications    Prior to Admission medications   Medication Sig Start Date End Date Taking? Authorizing Provider  methocarbamol (ROBAXIN) 500 MG tablet Take 1 tablet (500 mg total) by mouth 2 (two) times daily. 07/19/22  Yes Rinaldo Ratel, [place] N, FNP  allopurinol (ZYLOPRIM) 100 MG tablet Take 1 tablet (100 mg total) by mouth daily. 04/15/19   Grayce Sessions, NP  amLODipine (NORVASC) 10 MG tablet Take 1 tablet (10 mg total) by mouth daily. 05/25/21   Grayce Sessions, NP  colchicine 0.6 MG tablet Take 1 tablet (0.6 mg total) by mouth 2 (two) times daily for 10 days. 05/12/19 05/22/19  Grayce Sessions, NP  cyanocobalamin (VITAMIN B12) 1000 MCG tablet Take 1 tablet (1,000  mcg total) by mouth daily. 02/08/22   Briant Cedar, PA-C  ibuprofen (ADVIL) 800 MG tablet Take 800 mg by mouth 2 (two) times daily. 01/30/22   [provider]  losartan (COZAAR) 100 MG tablet Take 100 mg by mouth daily. 01/30/22   [provider]  metFORMIN (GLUCOPHAGE-XR) 500 MG 24 hr tablet Take 1 tablet (500 mg total) by mouth daily with breakfast. 05/25/21   Grayce Sessions, NP  Oxycodone HCl 10 MG TABS Take by mouth.    [provider]  pravastatin (PRAVACHOL) 40 MG tablet Take 1 tablet (40 mg total) by mouth daily. 04/15/19   Grayce Sessions, NP    Family History Family History  Problem Relation Age of Onset   Stroke Mother    Prostate cancer Father     Social History Social History   Tobacco Use   Smoking status: Never   Smokeless tobacco: Never  Vaping Use   Vaping Use: Never used  Substance Use Topics   Alcohol use: No    Alcohol/week: 0.0 standard drinks of alcohol   Drug use: No     Allergies   Aspirin   Review of Systems Review of Systems  Constitutional:  Negative for fever.  Musculoskeletal:  Positive for arthralgias and neck pain. Negative for joint swelling.  Neurological:  Negative for weakness.     Physical Exam Triage Vital Signs ED Triage Vitals  Enc Vitals Group     BP 07/19/22 1730 (!) 142/91     Pulse Rate 07/19/22 1730 96     Resp 07/19/22 1730 16     Temp 07/19/22 1730 98.2 F (36.8 C)     Temp Source 07/19/22 1730 Oral     SpO2 07/19/22 1730 98 %     Weight 07/19/22 1731 183 lb (83 kg)     Height 07/19/22 1731 5\' 9"  (1.753 m)     Head Circumference --      Peak Flow --      Pain Score 07/19/22 1731 9     Pain Loc --      Pain Edu? --      Excl. in GC? --    No data found.  Updated Vital Signs BP (!) 142/91   Pulse 96   Temp 98.2 F (36.8 C) (Oral)   Resp 16   Ht 5\' 9"  (1.753 m)   Wt 183 lb (83 kg)   SpO2 98%   BMI 27.02 kg/m   Visual Acuity Right Eye Distance:   Left Eye  Distance:   Bilateral Distance:    Right Eye Near:   Left Eye Near:    Bilateral Near:     Physical Exam Vitals and nursing note reviewed.  Constitutional:      Appearance: Normal appearance.  HENT:     Head: Normocephalic and atraumatic.     Right Ear: External ear normal.     Left Ear: External ear normal.     Nose: Nose normal.     Mouth/Throat:     Mouth: Mucous membranes are moist.  Eyes:     Conjunctiva/sclera: Conjunctivae normal.  Cardiovascular:     Rate and Rhythm: Normal rate and regular rhythm.     Heart sounds: Normal heart sounds. No murmur heard. Pulmonary:     Effort: Pulmonary effort is normal. No respiratory distress.     Breath sounds: Normal breath sounds.  Musculoskeletal:        General: Tenderness present. No swelling, deformity or signs of injury.     Left shoulder: Tenderness and bony tenderness present. No swelling, deformity, effusion, laceration or crepitus. Decreased range of motion. Normal strength. Normal pulse.     Cervical back: Normal range of motion.  Skin:    General: Skin is warm and dry.  Neurological:     Mental Status: He is alert.  Psychiatric:        Behavior: Behavior is cooperative.      UC Treatments / Results  Labs (all labs ordered are listed, but only abnormal results are displayed) Labs Reviewed - No data to display  EKG   Radiology DG Shoulder Left  Result Date: 07/19/2022 CLINICAL DATA:  Pain limited range of motion EXAM: LEFT SHOULDER - 2+ VIEW COMPARISON:  02/10/2021 FINDINGS: There is no evidence of fracture or dislocation. There is no evidence of arthropathy or other focal bone abnormality. Soft tissues are unremarkable. IMPRESSION: Negative. Electronically Signed   By: Jasmine Pang M.D.   On: 07/19/2022 18:11    Procedures Procedures (including critical care time)  Medications Ordered in UC Medications  methylPREDNISolone sodium succinate (SOLU-MEDROL) 125 mg/2 mL injection 80 mg (80 mg Intramuscular  Given 07/19/22 1835)  ketorolac (TORADOL) 30 MG/ML injection 30 mg (30 mg Intramuscular Given 07/19/22 1834)    Initial Impression /  Assessment and Plan / UC Course  I have reviewed the triage vital signs and the nursing notes.  Pertinent labs & imaging results that were available during my care of the patient were reviewed by me and considered in my medical decision making (see chart for details).  Vitals and triage reviewed, patient is hemodynamically stable.  Left shoulder pain radiating down his arm and up into his neck.  Limitation to internal and external rotation.  Diffuse tenderness palpated along the neck, shoulder and bicep area.  Imaging negative for fracture or dislocation, suspect shoulder strain versus musculoskeletal injury.  Given steroid injection and Toradol in clinic.  Advised muscle relaxer and anti-inflammatory.  Orthopedic follow-up as needed.  Plan of care, follow-up care and return precautions discussed, no questions at this time.     Final Clinical Impressions(s) / UC Diagnoses   Final diagnoses:  Strain of left shoulder, initial encounter     Discharge Instructions      Your x-ray was negative for fracture or dislocation.  I believe you have a musculoskeletal injury please rest and ice or heat the area.  He can take anti-inflammatories like 800 mg of ibuprofen every 8 hours in conjunction with the muscle relaxer.  Do not drink or drive on the muscle relaxer as it may make you drowsy.  If your symptoms persist beyond the next week or so, I suggest following up with [facility] for further evaluation.  You can return to clinic for any new or concerning symptoms.      ED Prescriptions     Medication Sig Dispense Auth. Provider   methocarbamol (ROBAXIN) 500 MG tablet Take 1 tablet (500 mg total) by mouth 2 (two) times daily. 20 tablet Rivka Baune, [place] N, [place]      I have reviewed the PDMP during this encounter.   Brayant Dorr, [place] N,  [place] 07/19/22 1946

## 2022-07-19 NOTE — Discharge Instructions (Signed)
Your x-ray was negative for fracture or dislocation.  I believe you have a musculoskeletal injury please rest and ice or heat the area.  He can take anti-inflammatories like 800 mg of ibuprofen every 8 hours in conjunction with the muscle relaxer.  Do not drink or drive on the muscle relaxer as it may make you drowsy.  If your symptoms persist beyond the next week or so, I suggest following up with [facility] for further evaluation.  You can return to clinic for any new or concerning symptoms.

## 2022-07-19 NOTE — ED Triage Notes (Signed)
Pt has had left arm pain for 2 weeks.  Pain worse with movement of arm under scapula area.  If he raises arm behind head pain improves.  Pain has been radiating from neck to arm today.  No hx of disc/neck problems.  Pt was at work today and reports he could not talk for a minute because the pain took his breath away.  Did have tingling in arm couple days ago.  If moves neck pain is worse.

## 2022-08-13 ENCOUNTER — Emergency Department (HOSPITAL_BASED_OUTPATIENT_CLINIC_OR_DEPARTMENT_OTHER): Payer: 59

## 2022-08-13 ENCOUNTER — Encounter (HOSPITAL_BASED_OUTPATIENT_CLINIC_OR_DEPARTMENT_OTHER): Payer: Self-pay | Admitting: Emergency Medicine

## 2022-08-13 ENCOUNTER — Other Ambulatory Visit: Payer: Self-pay

## 2022-08-13 ENCOUNTER — Emergency Department (HOSPITAL_BASED_OUTPATIENT_CLINIC_OR_DEPARTMENT_OTHER)
Admission: EM | Admit: 2022-08-13 | Discharge: 2022-08-13 | Disposition: A | Discharge status: Home / Self Care | Payer: 59 | Attending: Emergency Medicine | Admitting: Emergency Medicine

## 2022-08-13 ENCOUNTER — Other Ambulatory Visit (HOSPITAL_BASED_OUTPATIENT_CLINIC_OR_DEPARTMENT_OTHER): Payer: Self-pay

## 2022-08-13 DIAGNOSIS — M25512 Pain in left shoulder: Secondary | ICD-10-CM | POA: Diagnosis not present

## 2022-08-13 DIAGNOSIS — W19XXXA Unspecified fall, initial encounter: Secondary | ICD-10-CM

## 2022-08-13 DIAGNOSIS — R0602 Shortness of breath: Secondary | ICD-10-CM | POA: Diagnosis not present

## 2022-08-13 DIAGNOSIS — R0789 Other chest pain: Secondary | ICD-10-CM | POA: Diagnosis not present

## 2022-08-13 DIAGNOSIS — W1830XA Fall on same level, unspecified, initial encounter: Secondary | ICD-10-CM | POA: Diagnosis not present

## 2022-08-13 LAB — BASIC METABOLIC PANEL
Anion gap: 7 (ref 5–15)
BUN: 17 mg/dL (ref 6–20)
CO2: 29 mmol/L (ref 22–32)
Calcium: 8.8 mg/dL — ABNORMAL LOW (ref 8.9–10.3)
Chloride: 103 mmol/L (ref 98–111)
Creatinine, Ser: 1.11 mg/dL (ref 0.61–1.24)
GFR, Estimated: >60 mL/min (ref >60)
Glucose, Bld: 94 mg/dL (ref 70–99)
Potassium: 4.6 mmol/L (ref 3.5–5.1)
Sodium: 139 mmol/L (ref 135–145)

## 2022-08-13 LAB — CBC
HCT: 41.1 % (ref 39.0–52.0)
Hemoglobin: 13.1 g/dL (ref 13.0–17.0)
MCH: 26.1 pg (ref 26.0–34.0)
MCHC: 31.9 g/dL (ref 30.0–36.0)
MCV: 82 fL (ref 80.0–100.0)
Platelets: 298 10*3/uL (ref 150–400)
RBC: 5.01 MIL/uL (ref 4.22–5.81)
RDW: 13.9 % (ref 11.5–15.5)
WBC: 9.8 10*3/uL (ref 4.0–10.5)
nRBC: 0 % (ref 0.0–0.2)

## 2022-08-13 LAB — TROPONIN I (HIGH SENSITIVITY)
Troponin I (High Sensitivity): 3 ng/L (ref <18)
Troponin I (High Sensitivity): 3 ng/L (ref <18)

## 2022-08-13 MED ORDER — PREDNISONE 20 MG PO TABS
40.0000 mg | ORAL_TABLET | Freq: Every day | ORAL | 0 refills | Status: AC
  Filled 2022-08-13: qty 10, 5d supply, fill #0

## 2022-08-13 NOTE — ED Triage Notes (Signed)
Pt arrives pov, steady gait with c/o LT arm pain and weakness for "couple months", that worsened last night after feeling pop. Pt also endorses shob. PT speaking in complete sentences. Pt endorses LT side CP and back pain x 2 days pta.

## 2022-08-13 NOTE — ED Notes (Signed)
Pt just informed RN that he remembered he fell 3 days ago at work on the concrete and that is when his back and chest started hurting. He states his legs just gave out on him and he doesn't know why.

## 2022-08-13 NOTE — ED Notes (Signed)
Pt states he feels short of breath when lift left arm. Chest pain when try to move, and his back as well.the chest and back are new pain.

## 2022-08-13 NOTE — ED Provider Notes (Signed)
[place] EMERGENCY DEPARTMENT AT [facility] Provider Note   CSN: [id number] Arrival date & time: 08/13/22  1028     History Gout Chief Complaint  Patient presents with   Arm Pain   Chest Pain    Ricky Glass is a 57 y.o. male.  57 y.o male with a PMH of Gout presents to the ED with a chief complaint of left shoulder pain that has been ongoing for several months.  Patient reports previously being evaluated at [facility] ([facility] for his left arm pain.  However, 2 days ago she began to have some left-sided chest pain along with shortness of breath.  He feels like both of these where subsequent to the left shoulder pain.  He feels pain to the left shoulder especially with inspiration and expiration.  He is having pain as well to his left chest worsened with palpation.  He was taking prednisone for his left shoulder without any improvement in symptoms.  He denies any alleviating factors.  He does not have any prior history of CAD, no family history of CAD, no history of tobacco use.  Denies any other symptoms.  The history is provided by the patient.  Arm Pain Associated symptoms include chest pain and shortness of breath. Pertinent negatives include no abdominal pain and no headaches.  Chest Pain Associated symptoms: shortness of breath   Associated symptoms: no abdominal pain, no fever, no headache, no nausea and no vomiting        Home Medications Prior to Admission medications   Medication Sig Start Date End Date Taking? Authorizing Provider  predniSONE (DELTASONE) 20 MG tablet Take 2 tablets (40 mg total) by mouth daily for 5 days. 08/13/22 08/18/22 Yes Hailey Miles, Leonie Douglas, PA-C  allopurinol (ZYLOPRIM) 100 MG tablet Take 1 tablet (100 mg total) by mouth daily. 04/15/19   Grayce Sessions, NP  amLODipine (NORVASC) 10 MG tablet Take 1 tablet (10 mg total) by mouth daily. 05/25/21   Grayce Sessions, NP  colchicine 0.6 MG tablet Take 1 tablet (0.6 mg total) by mouth 2 (two) times  daily for 10 days. 05/12/19 05/22/19  Grayce Sessions, NP  cyanocobalamin (VITAMIN B12) 1000 MCG tablet Take 1 tablet (1,000 mcg total) by mouth daily. 02/08/22   Briant Cedar, PA-C  ibuprofen (ADVIL) 800 MG tablet Take 800 mg by mouth 2 (two) times daily. 01/30/22   [provider]  losartan (COZAAR) 100 MG tablet Take 100 mg by mouth daily. 01/30/22   [provider]  metFORMIN (GLUCOPHAGE-XR) 500 MG 24 hr tablet Take 1 tablet (500 mg total) by mouth daily with breakfast. 05/25/21   Grayce Sessions, NP  methocarbamol (ROBAXIN) 500 MG tablet Take 1 tablet (500 mg total) by mouth 2 (two) times daily. 07/19/22   Garrison, [place] N, FNP  Oxycodone HCl 10 MG TABS Take by mouth.    [provider]  pravastatin (PRAVACHOL) 40 MG tablet Take 1 tablet (40 mg total) by mouth daily. 04/15/19   Grayce Sessions, NP      Allergies    Aspirin    Review of Systems   Review of Systems  Constitutional:  Negative for chills and fever.  HENT:  Negative for sore throat.   Respiratory:  Positive for shortness of breath.   Cardiovascular:  Positive for chest pain.  Gastrointestinal:  Negative for abdominal pain, nausea and vomiting.  Musculoskeletal:  Positive for arthralgias.  Skin:  Negative for pallor and wound.  Neurological:  Negative for light-headedness and headaches.  All other systems reviewed and are negative.   Physical Exam Updated Vital Signs BP (!) 180/113   Pulse 60   Temp 97.6 F (36.4 C) (Oral)   Resp 14   Ht 5\' 9"  (1.753 m)   Wt 85.3 kg   SpO2 100%   BMI 27.76 kg/m  Physical Exam Vitals and nursing note reviewed.  Constitutional:      Appearance: He is well-developed.  HENT:     Head: Normocephalic and atraumatic.  Cardiovascular:     Rate and Rhythm: Normal rate.  Pulmonary:     Effort: Pulmonary effort is normal.     Breath sounds: No decreased breath sounds or wheezing.  Chest:     Chest wall: Tenderness present.  Abdominal:      General: There is no abdominal bruit.     Palpations: Abdomen is soft.  Musculoskeletal:     Cervical back: Normal range of motion and neck supple.     Right lower leg: No edema.     Left lower leg: No edema.  Skin:    General: Skin is warm and dry.  Neurological:     Mental Status: He is alert and oriented to person, place, and time.     ED Results / Procedures / Treatments   Labs (all labs ordered are listed, but only abnormal results are displayed) Labs Reviewed  BASIC METABOLIC PANEL - Abnormal; Notable for the following components:      Result Value   Calcium 8.8 (*)    All other components within normal limits  CBC  TROPONIN I (HIGH SENSITIVITY)  TROPONIN I (HIGH SENSITIVITY)    EKG EKG Interpretation  Date/Time:  Tuesday August 13 2022 10:50:09 EDT Ventricular Rate:  76 PR Interval:  130 QRS Duration: 80 QT Interval:  382 QTC Calculation: 429 R Axis:   56 Text Interpretation: Normal sinus rhythm Cannot rule out Anterior infarct (cited on or before 13-Aug-2022) Abnormal ECG When compared with ECG of 13-Aug-2022 10:49, No significant change was found Confirmed by Ernie Avena ([id number]) on 08/13/2022 12:42:10 PM  Radiology DG Chest Port 1 View  Result Date: 08/13/2022 CLINICAL DATA:  Shortness of breath EXAM: PORTABLE CHEST 1 VIEW COMPARISON:  X-ray 02/10/2021 FINDINGS: Hyperinflation. No consolidation, pneumothorax or effusion. No edema. Normal cardiopericardial silhouette. Overlapping cardiac leads. Film is rotated to the left. IMPRESSION: Hyperinflation.  No acute cardiopulmonary disease. Electronically Signed   By: Karen Kays M.D.   On: 08/13/2022 14:10    Procedures Procedures    Medications Ordered in ED Medications - No data to display  ED Course/ Medical Decision Making/ A&P                             Medical Decision Making Amount and/or Complexity of Data Reviewed Labs: ordered. Radiology: ordered.  Risk Prescription drug management.    This  patient presents to the ED for concern of left shoulder pain,cp,sob, this involves a number of treatment options, and is a complaint that carries with it a high risk of complications and morbidity.  The differential diagnosis includes ACS, MSK versus infection.    Co morbidities: Discussed in HPI   Brief History:  See HPI.   EMR reviewed including pt PMHx, past surgical history and past visits to ER.   See HPI for more details   Lab Tests:  I ordered and independently interpreted labs.  The pertinent results  include:    I personally reviewed all laboratory work and imaging. Metabolic panel without any acute abnormality specifically kidney function within normal limits and no significant electrolyte abnormalities. CBC without leukocytosis or significant anemia.   Imaging Studies:  NAD. I personally reviewed all imaging studies and no acute abnormality found. I agree with radiology interpretation.  Cardiac Monitoring:  The patient was maintained on a cardiac monitor.  I personally viewed and interpreted the cardiac monitored which showed an underlying rhythm of: NSR EKG non-ischemic   Medicines ordered:  N/A  Reevaluation:  After the interventions noted above I re-evaluated patient and found that they have :stayed the same   Social Determinants of Health:  The patient's social determinants of health were a factor in the care of this patient  Problem List / ED Course:  Patient with no past medical history presents to the ED with chief complaint of left shoulder pain that is been ongoing for several months, now endorsing pain along his chest along with shortness of breath over the last couple of days.  After patient has been in the emergency department for approximately 4 hours, he now recalls he suffered a fall 3 days ago where he actually landed on the left side striking his shoulder along with chest.  Blood work on today's visit has been unremarkable, he does not have  any prior history of CAD, no tobacco use, no underlying risk factors.  Does not endorse any tobacco use.  Pain along the left shoulder is exacerbated with any overhead reach concern for ligamentous injury as symptoms have been ongoing for some time.  Blood work is unremarkable, no electrolyte abnormality, troponin is negative, chest pain intraprostatic with palpation specially along the left side of her chest which took most of the impact.  No signs of hematoma or bruising noted.  His vitals are within normal limits and he is satting at 100% on room air.  Chest x-ray without signs of pneumonia, no signs of rib fractures or other injury.  We discussed appropriate follow-up with primary care physician, he will need to go see an orthopedist, this is the second time patient is evaluated for the left shoulder pain.  We discussed that we cannot continue to provide him with steroids that this is likely just numbing the pain at this time, needs further workup for this.  I do not suspect cardiac etiology at this time with a reassuring workup.  He is requesting discharge along with a new prescription for prednisone.  This will be given to him prior to discharge.  Patient stable for discharge  Dispostion:  After consideration of the diagnostic results and the patients response to treatment, I feel that the patent would benefit from outpatient follow up with PCP.    Portions of this note were generated with Scientist, clinical (histocompatibility and immunogenetics). Dictation errors may occur despite best attempts at proofreading.   Final Clinical Impression(s) / ED Diagnoses Final diagnoses:  Fall, initial encounter  Acute pain of left shoulder    Rx / DC Orders ED Discharge Orders          Ordered    predniSONE (DELTASONE) 20 MG tablet  Daily        08/13/22 1456              Claude Manges, PA-C 08/13/22 1518    Ernie Avena, MD 08/13/22 Ernestina Columbia

## 2022-08-13 NOTE — ED Notes (Signed)
RN reviewed discharge instructions with pt. Pt verbalized understanding and had no further questions. VSS upon discharge.  

## 2022-08-13 NOTE — Discharge Instructions (Signed)
Your laboratories also within normal limits today.  You were given a short prescription of steroids to help with your symptoms, please take 2 tablets daily for the next 5 days.  You will need to see an orthopedist, the number to Dr. Dion Saucier is attached to your chart, please schedule an appointment for further evaluation.

## 2022-08-16 DIAGNOSIS — M25512 Pain in left shoulder: Secondary | ICD-10-CM | POA: Diagnosis not present

## 2022-08-21 ENCOUNTER — Ambulatory Visit: Payer: 59 | Admitting: Dermatology

## 2022-10-08 ENCOUNTER — Ambulatory Visit: Payer: 59 | Admitting: Family Medicine

## 2022-10-08 ENCOUNTER — Encounter: Payer: Self-pay | Admitting: Family Medicine

## 2022-10-08 ENCOUNTER — Encounter: Payer: Self-pay | Admitting: Primary Care

## 2023-01-01 DIAGNOSIS — M25562 Pain in left knee: Secondary | ICD-10-CM | POA: Diagnosis not present

## 2023-01-01 DIAGNOSIS — M25512 Pain in left shoulder: Secondary | ICD-10-CM | POA: Diagnosis not present

## 2023-02-12 DIAGNOSIS — M7061 Trochanteric bursitis, right hip: Secondary | ICD-10-CM | POA: Diagnosis not present

## 2023-02-12 DIAGNOSIS — M25551 Pain in right hip: Secondary | ICD-10-CM | POA: Diagnosis not present

## 2023-03-25 ENCOUNTER — Emergency Department (HOSPITAL_COMMUNITY)
Admission: EM | Admit: 2023-03-25 | Discharge: 2023-03-26 | Discharge status: Against Medical Advice | Payer: 59 | Attending: Emergency Medicine | Admitting: Emergency Medicine

## 2023-03-25 ENCOUNTER — Other Ambulatory Visit: Payer: Self-pay

## 2023-03-25 DIAGNOSIS — R2232 Localized swelling, mass and lump, left upper limb: Secondary | ICD-10-CM | POA: Diagnosis not present

## 2023-03-25 DIAGNOSIS — R590 Localized enlarged lymph nodes: Secondary | ICD-10-CM | POA: Diagnosis not present

## 2023-03-25 DIAGNOSIS — Z5321 Procedure and treatment not carried out due to patient leaving prior to being seen by health care provider: Secondary | ICD-10-CM | POA: Diagnosis not present

## 2023-03-25 DIAGNOSIS — R1110 Vomiting, unspecified: Secondary | ICD-10-CM | POA: Diagnosis not present

## 2023-03-25 DIAGNOSIS — R112 Nausea with vomiting, unspecified: Secondary | ICD-10-CM | POA: Diagnosis not present

## 2023-03-25 DIAGNOSIS — M7989 Other specified soft tissue disorders: Secondary | ICD-10-CM | POA: Diagnosis not present

## 2023-03-25 DIAGNOSIS — M25571 Pain in right ankle and joints of right foot: Secondary | ICD-10-CM | POA: Diagnosis not present

## 2023-03-25 DIAGNOSIS — Z8739 Personal history of other diseases of the musculoskeletal system and connective tissue: Secondary | ICD-10-CM | POA: Diagnosis not present

## 2023-03-25 DIAGNOSIS — M25522 Pain in left elbow: Secondary | ICD-10-CM | POA: Diagnosis not present

## 2023-03-25 DIAGNOSIS — M109 Gout, unspecified: Secondary | ICD-10-CM | POA: Diagnosis not present

## 2023-03-25 DIAGNOSIS — R932 Abnormal findings on diagnostic imaging of liver and biliary tract: Secondary | ICD-10-CM | POA: Diagnosis not present

## 2023-03-25 DIAGNOSIS — M19022 Primary osteoarthritis, left elbow: Secondary | ICD-10-CM | POA: Diagnosis not present

## 2023-03-25 DIAGNOSIS — M2550 Pain in unspecified joint: Secondary | ICD-10-CM | POA: Insufficient documentation

## 2023-03-25 DIAGNOSIS — R2242 Localized swelling, mass and lump, left lower limb: Secondary | ICD-10-CM | POA: Insufficient documentation

## 2023-03-25 DIAGNOSIS — K429 Umbilical hernia without obstruction or gangrene: Secondary | ICD-10-CM | POA: Diagnosis not present

## 2023-03-25 LAB — CBC
HCT: 36.9 % — ABNORMAL LOW (ref 39.0–52.0)
Hemoglobin: 11.7 g/dL — ABNORMAL LOW (ref 13.0–17.0)
MCH: 26.6 pg (ref 26.0–34.0)
MCHC: 31.7 g/dL (ref 30.0–36.0)
MCV: 83.9 fL (ref 80.0–100.0)
Platelets: 392 10*3/uL (ref 150–400)
RBC: 4.4 MIL/uL (ref 4.22–5.81)
RDW: 14 % (ref 11.5–15.5)
WBC: 15.8 10*3/uL — ABNORMAL HIGH (ref 4.0–10.5)
nRBC: 0 % (ref 0.0–0.2)

## 2023-03-25 LAB — COMPREHENSIVE METABOLIC PANEL
ALT: 19 U/L (ref 0–44)
AST: 21 U/L (ref 15–41)
Albumin: 3.3 g/dL — ABNORMAL LOW (ref 3.5–5.0)
Alkaline Phosphatase: 64 U/L (ref 38–126)
Anion gap: 10 (ref 5–15)
BUN: 22 mg/dL — ABNORMAL HIGH (ref 6–20)
CO2: 22 mmol/L (ref 22–32)
Calcium: 8.8 mg/dL — ABNORMAL LOW (ref 8.9–10.3)
Chloride: 104 mmol/L (ref 98–111)
Creatinine, Ser: 1.56 mg/dL — ABNORMAL HIGH (ref 0.61–1.24)
GFR, Estimated: 52 mL/min — ABNORMAL LOW (ref >60)
Glucose, Bld: 105 mg/dL — ABNORMAL HIGH (ref 70–99)
Potassium: 3.4 mmol/L — ABNORMAL LOW (ref 3.5–5.1)
Sodium: 136 mmol/L (ref 135–145)
Total Bilirubin: 1.1 mg/dL (ref 0.0–1.2)
Total Protein: 8.1 g/dL (ref 6.5–8.1)

## 2023-03-25 LAB — URINALYSIS, ROUTINE W REFLEX MICROSCOPIC
Bilirubin Urine: NEGATIVE
Glucose, UA: NEGATIVE mg/dL
Hgb urine dipstick: NEGATIVE
Ketones, ur: 5 mg/dL — AB
Nitrite: NEGATIVE
Protein, ur: 100 mg/dL — AB
Specific Gravity, Urine: 1.017 (ref 1.005–1.030)
pH: 5 (ref 5.0–8.0)

## 2023-03-25 LAB — LIPASE, BLOOD: Lipase: 25 U/L (ref 11–51)

## 2023-03-25 NOTE — ED Triage Notes (Signed)
Pt says about a week ago he started having joint pain and swelling in his left knee., then his elbow and left hand about 3 days ago. Pt says he fell on Sunday because of the pain and feeling unbalanced. Took allopurinol without relief. Vomiting since Saturday. Has taken percocet and ibuprofen without relief.

## 2023-03-26 ENCOUNTER — Emergency Department (HOSPITAL_COMMUNITY): Payer: 59

## 2023-03-26 ENCOUNTER — Emergency Department (HOSPITAL_COMMUNITY)
Admission: EM | Admit: 2023-03-26 | Discharge: 2023-03-26 | Disposition: A | Discharge status: Home / Self Care | Payer: 59 | Source: Home / Self Care | Attending: Emergency Medicine | Admitting: Emergency Medicine

## 2023-03-26 ENCOUNTER — Encounter (HOSPITAL_COMMUNITY): Payer: Self-pay | Admitting: Emergency Medicine

## 2023-03-26 ENCOUNTER — Other Ambulatory Visit: Payer: Self-pay

## 2023-03-26 DIAGNOSIS — M25473 Effusion, unspecified ankle: Secondary | ICD-10-CM | POA: Insufficient documentation

## 2023-03-26 DIAGNOSIS — Z8739 Personal history of other diseases of the musculoskeletal system and connective tissue: Secondary | ICD-10-CM | POA: Diagnosis not present

## 2023-03-26 DIAGNOSIS — M7989 Other specified soft tissue disorders: Secondary | ICD-10-CM | POA: Insufficient documentation

## 2023-03-26 DIAGNOSIS — E1165 Type 2 diabetes mellitus with hyperglycemia: Secondary | ICD-10-CM | POA: Insufficient documentation

## 2023-03-26 DIAGNOSIS — M109 Gout, unspecified: Secondary | ICD-10-CM | POA: Diagnosis not present

## 2023-03-26 DIAGNOSIS — Z7984 Long term (current) use of oral hypoglycemic drugs: Secondary | ICD-10-CM | POA: Insufficient documentation

## 2023-03-26 DIAGNOSIS — I10 Essential (primary) hypertension: Secondary | ICD-10-CM | POA: Insufficient documentation

## 2023-03-26 DIAGNOSIS — W19XXXA Unspecified fall, initial encounter: Secondary | ICD-10-CM | POA: Diagnosis not present

## 2023-03-26 DIAGNOSIS — R42 Dizziness and giddiness: Secondary | ICD-10-CM | POA: Diagnosis not present

## 2023-03-26 DIAGNOSIS — R531 Weakness: Secondary | ICD-10-CM | POA: Diagnosis not present

## 2023-03-26 DIAGNOSIS — R590 Localized enlarged lymph nodes: Secondary | ICD-10-CM | POA: Diagnosis not present

## 2023-03-26 DIAGNOSIS — M19022 Primary osteoarthritis, left elbow: Secondary | ICD-10-CM | POA: Diagnosis not present

## 2023-03-26 DIAGNOSIS — R112 Nausea with vomiting, unspecified: Secondary | ICD-10-CM | POA: Insufficient documentation

## 2023-03-26 DIAGNOSIS — R932 Abnormal findings on diagnostic imaging of liver and biliary tract: Secondary | ICD-10-CM | POA: Diagnosis not present

## 2023-03-26 DIAGNOSIS — Z79899 Other long term (current) drug therapy: Secondary | ICD-10-CM | POA: Insufficient documentation

## 2023-03-26 DIAGNOSIS — R944 Abnormal results of kidney function studies: Secondary | ICD-10-CM | POA: Insufficient documentation

## 2023-03-26 DIAGNOSIS — E876 Hypokalemia: Secondary | ICD-10-CM | POA: Insufficient documentation

## 2023-03-26 DIAGNOSIS — M25522 Pain in left elbow: Secondary | ICD-10-CM | POA: Diagnosis not present

## 2023-03-26 DIAGNOSIS — M25571 Pain in right ankle and joints of right foot: Secondary | ICD-10-CM | POA: Diagnosis not present

## 2023-03-26 DIAGNOSIS — K429 Umbilical hernia without obstruction or gangrene: Secondary | ICD-10-CM | POA: Diagnosis not present

## 2023-03-26 DIAGNOSIS — R609 Edema, unspecified: Secondary | ICD-10-CM | POA: Diagnosis not present

## 2023-03-26 DIAGNOSIS — M2550 Pain in unspecified joint: Secondary | ICD-10-CM

## 2023-03-26 LAB — COMPREHENSIVE METABOLIC PANEL
ALT: 18 U/L (ref 0–44)
AST: 19 U/L (ref 15–41)
Albumin: 2.7 g/dL — ABNORMAL LOW (ref 3.5–5.0)
Alkaline Phosphatase: 60 U/L (ref 38–126)
Anion gap: 16 — ABNORMAL HIGH (ref 5–15)
BUN: 18 mg/dL (ref 6–20)
CO2: 19 mmol/L — ABNORMAL LOW (ref 22–32)
Calcium: 8.8 mg/dL — ABNORMAL LOW (ref 8.9–10.3)
Chloride: 100 mmol/L (ref 98–111)
Creatinine, Ser: 1.69 mg/dL — ABNORMAL HIGH (ref 0.61–1.24)
GFR, Estimated: 47 mL/min — ABNORMAL LOW (ref >60)
Glucose, Bld: 130 mg/dL — ABNORMAL HIGH (ref 70–99)
Potassium: 3.1 mmol/L — ABNORMAL LOW (ref 3.5–5.1)
Sodium: 135 mmol/L (ref 135–145)
Total Bilirubin: 1.1 mg/dL (ref 0.0–1.2)
Total Protein: 7.5 g/dL (ref 6.5–8.1)

## 2023-03-26 LAB — URINALYSIS, ROUTINE W REFLEX MICROSCOPIC
Bacteria, UA: NONE SEEN
Bilirubin Urine: NEGATIVE
Glucose, UA: NEGATIVE mg/dL
Hgb urine dipstick: NEGATIVE
Ketones, ur: 20 mg/dL — AB
Nitrite: NEGATIVE
Protein, ur: NEGATIVE mg/dL
Specific Gravity, Urine: >1.046 — ABNORMAL HIGH (ref 1.005–1.030)
pH: 5 (ref 5.0–8.0)

## 2023-03-26 LAB — CBC WITH DIFFERENTIAL/PLATELET
Abs Immature Granulocytes: 0.15 10*3/uL — ABNORMAL HIGH (ref 0.00–0.07)
Basophils Absolute: 0 10*3/uL (ref 0.0–0.1)
Basophils Relative: 0 %
Eosinophils Absolute: 0.1 10*3/uL (ref 0.0–0.5)
Eosinophils Relative: 0 %
HCT: 35.6 % — ABNORMAL LOW (ref 39.0–52.0)
Hemoglobin: 11.6 g/dL — ABNORMAL LOW (ref 13.0–17.0)
Immature Granulocytes: 1 %
Lymphocytes Relative: 15 %
Lymphs Abs: 2.1 10*3/uL (ref 0.7–4.0)
MCH: 27 pg (ref 26.0–34.0)
MCHC: 32.6 g/dL (ref 30.0–36.0)
MCV: 82.8 fL (ref 80.0–100.0)
Monocytes Absolute: 1 10*3/uL (ref 0.1–1.0)
Monocytes Relative: 7 %
Neutro Abs: 10.8 10*3/uL — ABNORMAL HIGH (ref 1.7–7.7)
Neutrophils Relative %: 77 %
Platelets: 377 10*3/uL (ref 150–400)
RBC: 4.3 MIL/uL (ref 4.22–5.81)
RDW: 13.9 % (ref 11.5–15.5)
WBC: 14.1 10*3/uL — ABNORMAL HIGH (ref 4.0–10.5)
nRBC: 0 % (ref 0.0–0.2)

## 2023-03-26 LAB — MAGNESIUM: Magnesium: 2 mg/dL (ref 1.7–2.4)

## 2023-03-26 LAB — LIPASE, BLOOD: Lipase: 23 U/L (ref 11–51)

## 2023-03-26 MED ORDER — ONDANSETRON 4 MG PO TBDP: 4.0000 mg | ORAL_TABLET | Freq: Three times a day (TID) | ORAL | 0 refills | Status: AC | PRN

## 2023-03-26 MED ORDER — LACTATED RINGERS IV BOLUS
1000.0000 mL | Freq: Once | INTRAVENOUS | Status: AC
  Administered 2023-03-26: 1000 mL via INTRAVENOUS

## 2023-03-26 MED ORDER — KETOROLAC TROMETHAMINE 30 MG/ML IJ SOLN
30.0000 mg | Freq: Once | INTRAMUSCULAR | Status: AC
  Administered 2023-03-26: 30 mg via INTRAVENOUS
  Filled 2023-03-26: qty 1

## 2023-03-26 MED ORDER — IOHEXOL 350 MG/ML SOLN
75.0000 mL | Freq: Once | INTRAVENOUS | Status: AC | PRN
  Administered 2023-03-26: 75 mL via INTRAVENOUS

## 2023-03-26 MED ORDER — POTASSIUM CHLORIDE 20 MEQ PO PACK
40.0000 meq | PACK | Freq: Two times a day (BID) | ORAL | Status: DC
  Administered 2023-03-26 (×2): 40 meq via ORAL
  Filled 2023-03-26 (×2): qty 2

## 2023-03-26 MED ORDER — ONDANSETRON 4 MG PO TBDP
8.0000 mg | ORAL_TABLET | Freq: Once | ORAL | Status: AC
  Administered 2023-03-26: 8 mg via ORAL
  Filled 2023-03-26: qty 2

## 2023-03-26 MED ORDER — ONDANSETRON HCL 4 MG/2ML IJ SOLN
4.0000 mg | Freq: Once | INTRAMUSCULAR | Status: AC
  Administered 2023-03-26: 4 mg via INTRAVENOUS
  Filled 2023-03-26: qty 2

## 2023-03-26 MED ORDER — PREDNISONE 10 MG PO TABS: 10.0000 mg | ORAL_TABLET | Freq: Every day | ORAL | 0 refills | Status: DC

## 2023-03-26 NOTE — ED Notes (Signed)
PT had a hard time getting the potassium down. States he has not eaten since this weekend because he has continuous nausea and vomiting when eating or drinking.

## 2023-03-26 NOTE — ED Triage Notes (Signed)
Pt bib ptar with 5 days of nausea and vomiting with dizziness and L sided weakness, swelling to L hand and L knee and bil ankle swelling. Went to [employer] yesterday for same but left before being seen.  150/90 HR 108 CBG 138

## 2023-03-26 NOTE — ED Provider Notes (Signed)
[place] EMERGENCY DEPARTMENT AT [facility] Provider Note   CSN: [id number] Arrival date & time: 03/26/23  4098     History  Chief Complaint  Patient presents with   Emesis    Ricky Glass is a 58 y.o. male with history of type 2 diabetes, gout, hypertension, presenting with concern for 5 days of nausea and vomiting.  Reports more difficulty eating and drinking.  Denies any abdominal pain, fever or chills, or diarrhea.  He also reports his left hand and left ankle became very swollen and painful a couple days ago.  He reports this is consistent with his normal gout flares.  States he normally takes prednisone at home for his flares, but he ran out of this medication.  He tried ibuprofen yesterday without relief of pain. This pain caused him to have a fall, but he denies hitting his head. He is not on any blood thinners   Emesis      Home Medications Prior to Admission medications   Medication Sig Start Date End Date Taking? Authorizing Provider  meloxicam (MOBIC) 15 MG tablet Take 15 mg by mouth daily. 02/13/23  Yes [provider]  ondansetron (ZOFRAN-ODT) 4 MG disintegrating tablet Take 1 tablet (4 mg total) by mouth every 8 (eight) hours as needed for nausea or vomiting. 03/26/23  Yes Arabella Merles, PA-C  predniSONE (DELTASONE) 10 MG tablet Take 1-4 tablets (10-40 mg total) by mouth daily. You may take 10-40mg  of prednisone (1-4 tablets) daily. Recommend 40mg  for 4 days (4 tablets), 30mg  for 3 days, 20mg  for 2 days. 03/26/23  Yes [name], MD  allopurinol (ZYLOPRIM) 100 MG tablet Take 1 tablet (100 mg total) by mouth daily. 04/15/19   Grayce Sessions, NP  amLODipine (NORVASC) 10 MG tablet Take 1 tablet (10 mg total) by mouth daily. 05/25/21   Grayce Sessions, NP  colchicine 0.6 MG tablet Take 1 tablet (0.6 mg total) by mouth 2 (two) times daily for 10 days. 05/12/19 05/22/19  Grayce Sessions, NP  cyanocobalamin (VITAMIN B12) 1000 MCG  tablet Take 1 tablet (1,000 mcg total) by mouth daily. 02/08/22   Briant Cedar, PA-C  ibuprofen (ADVIL) 800 MG tablet Take 800 mg by mouth 2 (two) times daily. 01/30/22   [provider]  losartan (COZAAR) 100 MG tablet Take 100 mg by mouth daily. 01/30/22   [provider]  metFORMIN (GLUCOPHAGE-XR) 500 MG 24 hr tablet Take 1 tablet (500 mg total) by mouth daily with breakfast. 05/25/21   Grayce Sessions, NP  methocarbamol (ROBAXIN) 500 MG tablet Take 1 tablet (500 mg total) by mouth 2 (two) times daily. 07/19/22   Garrison, [place] N, FNP  Oxycodone HCl 10 MG TABS Take by mouth.    [provider]  pravastatin (PRAVACHOL) 40 MG tablet Take 1 tablet (40 mg total) by mouth daily. 04/15/19   Grayce Sessions, NP      Allergies    Aspirin    Review of Systems   Review of Systems  Gastrointestinal:  Positive for vomiting.    Physical Exam Updated Vital Signs BP (!) 144/94 (BP Location: Left Arm)   Pulse 100   Temp 99 F (37.2 C) (Oral)   Resp 18   Ht 5\' 9"  (1.753 m)   Wt 81.6 kg   SpO2 100%   BMI 26.58 kg/m  Physical Exam Vitals and nursing note reviewed.  Constitutional:      General: He is not in  acute distress.    Appearance: He is well-developed.     Comments: Well-appearing, tolerating sips of his oral potassium repletion.  No active vomiting  HENT:     Head: Normocephalic and atraumatic.  Eyes:     Conjunctiva/sclera: Conjunctivae normal.  Cardiovascular:     Rate and Rhythm: Normal rate and regular rhythm.     Heart sounds: No murmur heard.    Comments: 2+ dorsalis pedis pulse bilaterally 2+ radial pulse bilaterally Pulmonary:     Effort: Pulmonary effort is normal. No respiratory distress.     Breath sounds: Normal breath sounds.  Abdominal:     Palpations: Abdomen is soft.     Tenderness: There is no abdominal tenderness.  Musculoskeletal:        General: No swelling.     Cervical back: Neck supple.     Comments: Edema to the  dorsum of the left hand  Edema to the left ankle and left foot diffusely.  Extremely tender to light touch of the left ankle and left foot diffusely.   Able to fully flex and extend at left and right wrist.  Able to plantarflex, dorsiflex, invert, evert at the left and right ankles  Skin:    General: Skin is warm and dry.     Capillary Refill: Capillary refill takes less than 2 seconds.     Comments: Skin overlying left hand and left foot without any abrasions, lacerations, erythema   Neurological:     Mental Status: He is alert.     Comments: Intact sensation of the bilateral lower extremities  Psychiatric:        Mood and Affect: Mood normal.     ED Results / Procedures / Treatments   Labs (all labs ordered are listed, but only abnormal results are displayed) Labs Reviewed  CBC WITH DIFFERENTIAL/PLATELET - Abnormal; Notable for the following components:      Result Value   WBC 14.1 (*)    Hemoglobin 11.6 (*)    HCT 35.6 (*)    Neutro Abs 10.8 (*)    Abs Immature Granulocytes 0.15 (*)    All other components within normal limits  COMPREHENSIVE METABOLIC PANEL - Abnormal; Notable for the following components:   Potassium 3.1 (*)    CO2 19 (*)    Glucose, Bld 130 (*)    Creatinine, Ser 1.69 (*)    Calcium 8.8 (*)    Albumin 2.7 (*)    GFR, Estimated 47 (*)    Anion gap 16 (*)    All other components within normal limits  URINALYSIS, ROUTINE W REFLEX MICROSCOPIC - Abnormal; Notable for the following components:   Specific Gravity, Urine >1.046 (*)    Ketones, ur 20 (*)    Leukocytes,Ua TRACE (*)    All other components within normal limits  LIPASE, BLOOD  MAGNESIUM    EKG None  Radiology CT ABDOMEN PELVIS W CONTRAST Result Date: 03/26/2023 CLINICAL DATA:  Abdominal pain, acute, nonlocalized. Nausea and vomiting. No abdominopelvic ascites. No intraperitoneal free air. Small fat containing umbilical hernia. EXAM: CT ABDOMEN AND PELVIS WITH CONTRAST TECHNIQUE:  Multidetector CT imaging of the abdomen and pelvis was performed using the standard protocol following bolus administration of intravenous contrast. RADIATION DOSE REDUCTION: This exam was performed according to the departmental dose-optimization program which includes automated exposure control, adjustment of the mA and/or kV according to patient size and/or use of iterative reconstruction technique. CONTRAST:  75mL OMNIPAQUE IOHEXOL 350 MG/ML SOLN COMPARISON:  CTA  chest, abdomen, and pelvis dated April 30, 2020. FINDINGS: Lower chest: No acute abnormality. Mild subsegmental scarring/atelectasis in the right middle lobe and lingula. Hepatobiliary: Focal subcentimeter hypodensities within the right hepatic lobe are too small to definitively characterize and may represent small cysts. Gallbladder is unremarkable. No biliary dilatation. Pancreas: Unremarkable. No pancreatic ductal dilatation or surrounding inflammatory changes. Spleen: Normal in size without focal abnormality. Adrenals/Urinary Tract: Adrenal glands are unremarkable. Kidneys are normal, without renal calculi, suspicious focal lesion, or hydronephrosis. Bladder is partially distended and otherwise unremarkable. Stomach/Bowel: Stomach is within normal limits. Appendix appears normal. 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 No evidence of bowel wall thickening, distention, or inflammatory changes. Vascular/Lymphatic: The abdominal aorta is normal in caliber. Aortic atherosclerosis. 11 mm borderline enlarged left inguinal lymph node, similar compared to the prior exam. Reproductive: Prostate is unremarkable. Other: No abdominopelvic ascites. No intraperitoneal free air. Small fat containing umbilical hernia. Musculoskeletal: No acute osseous abnormality. No suspicious osseous lesion. Levocurvature of the mid lumbar spine. Advanced disc height loss, desiccation, and endplate degenerative changes at L2-L3 and L4-L5,  progressed since the prior exam. Progression of degenerative disc changes at L3-L4 with similar grade 1 retrolisthesis. IMPRESSION: 1. No acute localizing findings in the abdomen or pelvis. 2. 11 mm borderline enlarged nonspecific left inguinal lymph node is similar to the prior exam and may be reactive. 3. 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 Aortic Atherosclerosis (ICD10-I70.0). Electronically Signed   By: Hart Robinsons M.D.   On: 03/26/2023 13:24   DG Ankle Complete Right Result Date: 03/26/2023 CLINICAL DATA:  Pain and swelling.  History of gout. EXAM: RIGHT ANKLE - COMPLETE 3 VIEW COMPARISON:  None Available. FINDINGS: There is no evidence of fracture, dislocation, or joint effusion. Minor spurring at the medial malleolus. Non eroded plantar heel spur. IMPRESSION: No acute or erosive finding. Electronically Signed   By: Tiburcio Pea M.D.   On: 03/26/2023 08:52   DG Elbow Complete Left Result Date: 03/26/2023 CLINICAL DATA:  pain and swelling. No known injury. History of gout. EXAM: LEFT ELBOW - COMPLETE 3+ VIEW COMPARISON:  None Available. FINDINGS: No acute fracture or dislocation. No aggressive osseous lesion. Mild degenerative arthritis of elbow joint. No radiopaque foreign bodies. There is moderate-to-large soft tissue swelling with irregular calcifications in the olecranon bursa region without underlying bone erosions/destruction. Findings compatible with gouty arthropathy. IMPRESSION: *No acute osseous abnormality of the left elbow. *Left olecranon gouty arthropathy/tophus. Electronically Signed   By: Jules Schick M.D.   On: 03/26/2023 08:48   DG Ankle Complete Left Result Date: 03/26/2023 CLINICAL DATA:  pain and swelling. No known injury. History of gout. EXAM: LEFT ANKLE COMPLETE - 3+ VIEW; LEFT KNEE - COMPLETE 4+ VIEW COMPARISON:  None Available. FINDINGS: No acute fracture or dislocation. No aggressive  osseous lesion. Mild degenerative changes of the left knee and ankle joints. The ankle mortise is congruent. No knee effusion or focal soft tissue swelling. No radiopaque foreign bodies. IMPRESSION: 1. No acute osseous abnormality of the left knee or ankle. 2. Mild degenerative joint disease. Electronically Signed   By: Jules Schick M.D.   On: 03/26/2023 08:45   DG Knee Complete 4 Views Left Result Date: 03/26/2023 CLINICAL DATA:  pain and swelling. No known injury. History of gout. EXAM: LEFT ANKLE COMPLETE - 3+ VIEW; LEFT KNEE - COMPLETE 4+ VIEW COMPARISON:  None Available. FINDINGS: No acute fracture or dislocation.  No aggressive osseous lesion. Mild degenerative changes of the left knee and ankle joints. The ankle mortise is congruent. No knee effusion or focal soft tissue swelling. No radiopaque foreign bodies. IMPRESSION: 1. No acute osseous abnormality of the left knee or ankle. 2. Mild degenerative joint disease. Electronically Signed   By: Jules Schick M.D.   On: 03/26/2023 08:45    Procedures Procedures    Medications Ordered in ED Medications  potassium chloride (KLOR-CON) packet 40 mEq (40 mEq Oral Given 03/26/23 2132)  ondansetron (ZOFRAN-ODT) disintegrating tablet 8 mg (8 mg Oral Given 03/26/23 0742)  iohexol (OMNIPAQUE) 350 MG/ML injection 75 mL (75 mLs Intravenous Contrast Given 03/26/23 1204)  lactated ringers bolus 1,000 mL (0 mLs Intravenous Stopped 03/26/23 1855)  ketorolac (TORADOL) 30 MG/ML injection 30 mg (30 mg Intravenous Given 03/26/23 1857)  ondansetron (ZOFRAN) injection 4 mg (4 mg Intravenous Given 03/26/23 2100)    ED Course/ Medical Decision Making/ A&P                                 Medical Decision Making Amount and/or Complexity of Data Reviewed Labs: ordered.  Risk Prescription drug management.     Differential diagnosis includes but is not limited to Gout, fracture, dislocation, arthritis, rheumatoid arthritis, septic arthritis, Cholelithiasis,  cholangitis, choledocholithiasis, peptic ulcer, gastritis, gastroenteritis, appendicitis, IBS, IBD, DKA, nephrolithiasis, UTI, pyelonephritis, pancreatitis, diverticulitis, mesenteric ischemia, abdominal aortic aneurysm, small bowel obstruction, volvulus, testicular torsion in males, ovarian torsion and pregnancy related concerns in females of childbearing age    ED Course:  Patient well-appearing, stable vital signs aside from a slightly elevated blood pressure of 146/92 upon arrival.  He is concern for some nausea and vomiting over the past couple of days.  Abdomen soft nontender.  CT abdomen pelvis was obtained which showed no acute abnormality.  CMP showed hypokalemia at 3.1, elevated creatinine at 1.69, up from 1.1 which seems to be his baseline.  Anion gap is 16.  Urinalysis with high specific gravity and trace leukocytes.  Low concern for any acute abdominal pathology or euglycemic DKA at this time.  He denies any urinary symptoms, low concern for UTI at this time.  Given high specific gravity on urine, poor p.o. intake over past couple of days, increased creatinine, suspect he is dehydrated.  Suspect he may have a viral gastroenteritis causing his nausea over the past couple of days.  He was given Zofran for nausea and oral potassium for hypokalemia He was given 1 LR bolus for dehydration Upon reevaluation, patient states his nausea was well-controlled with Zofran.  Tolerating p.o. intake of his oral potassium repletion.  Patient also reports concern for pain to his left hand as well as left ankle and foot.  These areas are swollen, but no erythema.  He states this pain caused him to have a fall. Denies hitting his head.  X-rays of the left and right ankle, left elbow, and left knee were obtained which showed no acute abnormalities.  He believes this to be gout, but due to the widespread nature and involvement of multiple joints, feel this is less likely.  He denies any abrasions or wounds, fevers  or chills, and since he states this is a recurrent problem, less concern for septic arthritis at this time.  More concern for possible RA or osteoarthritis. He was given Toradol for pain.  On reevaluation, states this helped his pain significantly.  My attending Dr.  Schlossman also evaluated patient an is in agreement with this plan and feels patient appropriate for discharge home.    Impression: Left ankle and left hand pain Nausea and vomiting  Disposition:  The patient was discharged home with instructions to take prednisone taper as prescribed.  May take Tylenol and ibuprofen as needed for pain. Take prescribed zofran as needed for nausea. Keep well-hydrated at home with plenty of water, avoid sugary beverages.  Follow-up with PCP in the next week for a follow up and for further management of blood pressure and blood sugars. Return precautions given.  Imaging Studies ordered: I ordered imaging studies including CT abdomen pelvis I independently visualized the imaging with scope of interpretation limited to determining acute life threatening conditions related to emergency care. Imaging showed no acute abnormalities I agree with the radiologist interpretation  Labs I Ordered, and personally interpreted labs.  The pertinent results include:   CBC with leukocytosis 14.1, Hemoglobin 11.6 which is at baseline CMP with hypokalemia 3.1, elevated glucose at 130, elevated creatinine at 1.69, anion gap at 16 Urinalysis with high specific gravity, ketones, trace leukocytes Lipase within normal limits Magnesium within normal limits                   Final Clinical Impression(s) / ED Diagnoses Final diagnoses:  Polyarthralgia  History of gout  Nausea and vomiting, unspecified vomiting type    Rx / DC Orders ED Discharge Orders          Ordered    predniSONE (DELTASONE) 10 MG tablet  Daily        03/26/23 2214    ondansetron (ZOFRAN-ODT) 4 MG disintegrating tablet   Every 8 hours PRN        03/26/23 2239              Arabella Merles, PA-C 03/27/23 0004    [name], MD 04/01/23 1035

## 2023-03-26 NOTE — ED Provider Triage Note (Addendum)
Emergency Medicine Provider Triage Evaluation Note  Ricky Glass , a 58 y.o. male  was evaluated in triage.  Pt complains of abdominal pain nausea and vomiting.  Started about 5 days ago.  Pain is all about the lower abdomen.  Also reports some intermittent diarrhea.  Also states that he fell on the left side today.  Denies hitting his head.  States that his left elbow and left knee hurt along with both ankles.  Endorses significant swelling in the left elbow.  Review of Systems  Positive: See above Negative: See above  Physical Exam  There were no vitals taken for this visit. Gen:   Awake, no distress   Resp:  Normal effort  MSK:   Moves extremities without difficulty  Other:    Medical Decision Making  Medically screening exam initiated at 7:28 AM.  Appropriate orders placed.  Ricky Glass was informed that the remainder of the evaluation will be completed by another provider, this initial triage assessment does not replace that evaluation, and the importance of remaining in the ED until their evaluation is complete.  Work up started   Gareth Eagle, PA-C 03/26/23 0729    Gareth Eagle, PA-C 03/26/23 [phone]

## 2023-03-26 NOTE — ED Notes (Signed)
Ricky Glass inquired about wait, says he feels like his hand is more swollen and he "has back problems and I can't sit it the chair". Offered pt to reposition in w/c and put his feet up. Pulses intact, does not have appear to have increased swelling compared to initial assessment by this RN. Pt says "I am just going to call my ride, I have back problems and I can't sit here."

## 2023-03-26 NOTE — ED Notes (Signed)
Lt green sent to lab for [facility]

## 2023-03-26 NOTE — Discharge Instructions (Addendum)
Your nausea and vomiting is likely secondary to a stomach bug.  The CT of your abdomen and pelvis did not show any acute abnormalities.  You have been prescribed Zofran to use as needed for nausea.  You may take this up to every 8 hours.  Your potassium was slightly low here today.  You were given a potassium supplement.  Please continue to eat potassium rich foods at home such as bananas, avocados, potatoes.  Your creatinine (kidney function lab) was abnormal today. This likely is due to dehydration. You were given fluids here today.  Please continue to drink plenty of water at home so that your urine is a light yellow color.  Please avoid sugary beverages such as juices and sodas.  Your white blood cell count was elevated today. This can indicate infection/inflammation within the body.   Your pancreas and liver labs were normal today.  The x-rays of your right and left ankles, left knee, and left elbow show no fractures or dislocations.  The pain in your hand and foot most likely is due to some sort of arthritis. You may need to be worked up for other etiologies to your pain such as rheumatoid arthritis by your PCP.  This is less likely gout due to it involving the whole foot and hand.  You have been prescribed prednisone to help with your pain.  Please take this as prescribed.  You may use up to 600mg  ibuprofen every 6 hours as needed for pain.  Do not exceed 2.4g of ibuprofen per day. Do not take this continuously for longer than 2 days.  Please follow up with your PCP within the next week to ensure your symptoms are improving and to follow up about your blood sugars and elevated blood pressure (155/91) here today.   The CT read of your abdomen pelvis is listed below, please make your PCP aware of these findings: IMPRESSION:  1. No acute localizing findings in the abdomen or pelvis.  2. 11 mm borderline enlarged nonspecific left inguinal lymph node is  similar to the prior exam and may  be reactive.  3. 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    Aortic Atherosclerosis (ICD10-I70.0).

## 2023-04-11 ENCOUNTER — Emergency Department (HOSPITAL_COMMUNITY): Payer: 59

## 2023-04-11 ENCOUNTER — Other Ambulatory Visit: Payer: Self-pay

## 2023-04-11 ENCOUNTER — Emergency Department (HOSPITAL_COMMUNITY)
Admission: EM | Admit: 2023-04-11 | Discharge: 2023-04-12 | Disposition: A | Discharge status: Home / Self Care | Payer: 59 | Attending: Emergency Medicine | Admitting: Emergency Medicine

## 2023-04-11 DIAGNOSIS — Z7984 Long term (current) use of oral hypoglycemic drugs: Secondary | ICD-10-CM | POA: Insufficient documentation

## 2023-04-11 DIAGNOSIS — Z79899 Other long term (current) drug therapy: Secondary | ICD-10-CM | POA: Diagnosis not present

## 2023-04-11 DIAGNOSIS — R42 Dizziness and giddiness: Secondary | ICD-10-CM | POA: Diagnosis present

## 2023-04-11 DIAGNOSIS — R0602 Shortness of breath: Secondary | ICD-10-CM | POA: Diagnosis not present

## 2023-04-11 DIAGNOSIS — I10 Essential (primary) hypertension: Secondary | ICD-10-CM | POA: Diagnosis not present

## 2023-04-11 DIAGNOSIS — E119 Type 2 diabetes mellitus without complications: Secondary | ICD-10-CM | POA: Insufficient documentation

## 2023-04-11 LAB — COMPREHENSIVE METABOLIC PANEL
ALT: 14 U/L (ref 0–44)
AST: 16 U/L (ref 15–41)
Albumin: 3.2 g/dL — ABNORMAL LOW (ref 3.5–5.0)
Alkaline Phosphatase: 54 U/L (ref 38–126)
Anion gap: 9 (ref 5–15)
BUN: 23 mg/dL — ABNORMAL HIGH (ref 6–20)
CO2: 22 mmol/L (ref 22–32)
Calcium: 8 mg/dL — ABNORMAL LOW (ref 8.9–10.3)
Chloride: 102 mmol/L (ref 98–111)
Creatinine, Ser: 1.31 mg/dL — ABNORMAL HIGH (ref 0.61–1.24)
GFR, Estimated: >60 mL/min (ref >60)
Glucose, Bld: 188 mg/dL — ABNORMAL HIGH (ref 70–99)
Potassium: 4 mmol/L (ref 3.5–5.1)
Sodium: 133 mmol/L — ABNORMAL LOW (ref 135–145)
Total Bilirubin: 1 mg/dL (ref 0.0–1.2)
Total Protein: 6.4 g/dL — ABNORMAL LOW (ref 6.5–8.1)

## 2023-04-11 LAB — CBC WITH DIFFERENTIAL/PLATELET
Abs Immature Granulocytes: 0.2 10*3/uL — ABNORMAL HIGH (ref 0.00–0.07)
Basophils Absolute: 0 10*3/uL (ref 0.0–0.1)
Basophils Relative: 0 %
Eosinophils Absolute: 0.1 10*3/uL (ref 0.0–0.5)
Eosinophils Relative: 1 %
HCT: 37.8 % — ABNORMAL LOW (ref 39.0–52.0)
Hemoglobin: 11.6 g/dL — ABNORMAL LOW (ref 13.0–17.0)
Immature Granulocytes: 1 %
Lymphocytes Relative: 11 %
Lymphs Abs: 1.7 10*3/uL (ref 0.7–4.0)
MCH: 26.5 pg (ref 26.0–34.0)
MCHC: 30.7 g/dL (ref 30.0–36.0)
MCV: 86.3 fL (ref 80.0–100.0)
Monocytes Absolute: 1.1 10*3/uL — ABNORMAL HIGH (ref 0.1–1.0)
Monocytes Relative: 7 %
Neutro Abs: 12.9 10*3/uL — ABNORMAL HIGH (ref 1.7–7.7)
Neutrophils Relative %: 80 %
Platelets: 254 10*3/uL (ref 150–400)
RBC: 4.38 MIL/uL (ref 4.22–5.81)
RDW: 15.6 % — ABNORMAL HIGH (ref 11.5–15.5)
WBC: 16 10*3/uL — ABNORMAL HIGH (ref 4.0–10.5)
nRBC: 0 % (ref 0.0–0.2)

## 2023-04-11 LAB — URINALYSIS, ROUTINE W REFLEX MICROSCOPIC
Bacteria, UA: NONE SEEN
Bilirubin Urine: NEGATIVE
Glucose, UA: NEGATIVE mg/dL
Hgb urine dipstick: NEGATIVE
Ketones, ur: NEGATIVE mg/dL
Nitrite: NEGATIVE
Protein, ur: NEGATIVE mg/dL
Specific Gravity, Urine: 1.016 (ref 1.005–1.030)
pH: 5 (ref 5.0–8.0)

## 2023-04-11 LAB — RESP PANEL BY RT-PCR (RSV, FLU A&B, COVID)  RVPGX2
Influenza A by PCR: NEGATIVE
Influenza B by PCR: NEGATIVE
Resp Syncytial Virus by PCR: NEGATIVE
SARS Coronavirus 2 by RT PCR: NEGATIVE

## 2023-04-11 LAB — D-DIMER, QUANTITATIVE: D-Dimer, Quant: 1.42 ug{FEU}/mL — ABNORMAL HIGH (ref 0.00–0.50)

## 2023-04-11 MED ORDER — LACTATED RINGERS IV BOLUS
1000.0000 mL | Freq: Once | INTRAVENOUS | Status: AC
  Administered 2023-04-11: 1000 mL via INTRAVENOUS

## 2023-04-11 MED ORDER — IOHEXOL 350 MG/ML SOLN
100.0000 mL | Freq: Once | INTRAVENOUS | Status: AC | PRN
  Administered 2023-04-11: 100 mL via INTRAVENOUS

## 2023-04-11 NOTE — ED Provider Notes (Signed)
[place] EMERGENCY DEPARTMENT AT [facility] Provider Note   CSN: [id number] Arrival date & time: 04/11/23  2004     History  Chief Complaint  Patient presents with   Weakness   Dizziness    Ricky Glass is a 59 y.o. male.  58 year old male with a history of diabetes, gout, and hypertension who presents emergency department with dizziness and weakness.  Patient reports that 2 nights ago did not sleep well.  Says that he has felt off since then.  Today has had weakness and dizziness that has been worsening.  Nausea but no vomiting.  No fevers.  Says he has had some shortness of breath today that is worsened with exertion.  No chest pain.  No cough recently.  Says he was at work and folic he was going to pass out so called 911.  They reported that his blood sugar was 269.  He was told that his blood pressure was low but we do not have a report of how low it was.  He was given some Zofran for his nausea.  Says he was on a course of prednisone       Home Medications Prior to Admission medications   Medication Sig Start Date End Date Taking? Authorizing Provider  allopurinol (ZYLOPRIM) 100 MG tablet Take 1 tablet (100 mg total) by mouth daily. 04/15/19   Grayce Sessions, NP  amLODipine (NORVASC) 10 MG tablet Take 1 tablet (10 mg total) by mouth daily. 05/25/21   Grayce Sessions, NP  colchicine 0.6 MG tablet Take 1 tablet (0.6 mg total) by mouth 2 (two) times daily for 10 days. 05/12/19 05/22/19  Grayce Sessions, NP  cyanocobalamin (VITAMIN B12) 1000 MCG tablet Take 1 tablet (1,000 mcg total) by mouth daily. 02/08/22   Briant Cedar, PA-C  ibuprofen (ADVIL) 800 MG tablet Take 800 mg by mouth 2 (two) times daily. 01/30/22   [provider]  losartan (COZAAR) 100 MG tablet Take 100 mg by mouth daily. 01/30/22   [provider]  meloxicam (MOBIC) 15 MG tablet Take 15 mg by mouth daily. 02/13/23   [provider]  metFORMIN (GLUCOPHAGE-XR)  500 MG 24 hr tablet Take 1 tablet (500 mg total) by mouth daily with breakfast. 05/25/21   Grayce Sessions, NP  methocarbamol (ROBAXIN) 500 MG tablet Take 1 tablet (500 mg total) by mouth 2 (two) times daily. 07/19/22   Garrison, [place] N, FNP  ondansetron (ZOFRAN-ODT) 4 MG disintegrating tablet Take 1 tablet (4 mg total) by mouth every 8 (eight) hours as needed for nausea or vomiting. 03/26/23   Arabella Merles, PA-C  Oxycodone HCl 10 MG TABS Take by mouth.    [provider]  pravastatin (PRAVACHOL) 40 MG tablet Take 1 tablet (40 mg total) by mouth daily. 04/15/19   Grayce Sessions, NP  predniSONE (DELTASONE) 10 MG tablet Take 1-4 tablets (10-40 mg total) by mouth daily. You may take 10-40mg  of prednisone (1-4 tablets) daily. Recommend 40mg  for 4 days (4 tablets), 30mg  for 3 days, 20mg  for 2 days. 03/26/23   [name], MD      Allergies    Aspirin    Review of Systems   Review of Systems  Physical Exam Updated Vital Signs BP (!) 144/91 (BP Location: Right Arm)   Pulse 76   Temp 98.1 F (36.7 C) (Oral)   Resp 16   SpO2 97%  Physical Exam Vitals and nursing note reviewed.  Constitutional:  General: He is not in acute distress.    Appearance: He is well-developed.  HENT:     Head: Normocephalic and atraumatic.     Right Ear: External ear normal.     Left Ear: External ear normal.     Nose: Nose normal.  Eyes:     Extraocular Movements: Extraocular movements intact.     Conjunctiva/sclera: Conjunctivae normal.     Pupils: Pupils are equal, round, and reactive to light.  Cardiovascular:     Rate and Rhythm: Normal rate and regular rhythm.     Heart sounds: Normal heart sounds.  Pulmonary:     Effort: Pulmonary effort is normal. No respiratory distress.     Breath sounds: Normal breath sounds.  Abdominal:     General: There is no distension.     Palpations: Abdomen is soft. There is no mass.     Tenderness: There is no abdominal tenderness. There is  no guarding.  Musculoskeletal:     Cervical back: Normal range of motion and neck supple.     Right lower leg: No edema.     Left lower leg: No edema.  Skin:    General: Skin is warm and dry.  Neurological:     Mental Status: He is alert. Mental status is at baseline.  Psychiatric:        Mood and Affect: Mood normal.        Behavior: Behavior normal.     ED Results / Procedures / Treatments   Labs (all labs ordered are listed, but only abnormal results are displayed) Labs Reviewed  CBC WITH DIFFERENTIAL/PLATELET - Abnormal; Notable for the following components:      Result Value   WBC 16.0 (*)    Hemoglobin 11.6 (*)    HCT 37.8 (*)    RDW 15.6 (*)    Neutro Abs 12.9 (*)    Monocytes Absolute 1.1 (*)    Abs Immature Granulocytes 0.20 (*)    All other components within normal limits  COMPREHENSIVE METABOLIC PANEL - Abnormal; Notable for the following components:   Sodium 133 (*)    Glucose, Bld 188 (*)    BUN 23 (*)    Creatinine, Ser 1.31 (*)    Calcium 8.0 (*)    Total Protein 6.4 (*)    Albumin 3.2 (*)    All other components within normal limits  URINALYSIS, ROUTINE W REFLEX MICROSCOPIC - Abnormal; Notable for the following components:   Leukocytes,Ua TRACE (*)    All other components within normal limits  D-DIMER, QUANTITATIVE - Abnormal; Notable for the following components:   D-Dimer, Quant 1.42 (*)    All other components within normal limits  RESP PANEL BY RT-PCR (RSV, FLU A&B, COVID)  RVPGX2  CBG MONITORING, ED    EKG None  Radiology CT Angio Chest PE W and/or Wo Contrast Result Date: 04/11/2023 CLINICAL DATA:  Positive D-dimer and weakness, initial encounter EXAM: CT ANGIOGRAPHY CHEST WITH CONTRAST TECHNIQUE: Multidetector CT imaging of the chest was performed using the standard protocol during bolus administration of intravenous contrast. Multiplanar CT image reconstructions and MIPs were obtained to evaluate the vascular anatomy. RADIATION DOSE  REDUCTION: This exam was performed according to the departmental dose-optimization program which includes automated exposure control, adjustment of the mA and/or kV according to patient size and/or use of iterative reconstruction technique. CONTRAST:  OMNIPAQUE IOHEXOL 350 MG/ML SOLN COMPARISON:  Chest x-ray from earlier in the same day. FINDINGS: Cardiovascular: Thoracic aorta and  its branches are well visualized. No aneurysmal dilatation or dissection is noted. No cardiac enlargement is seen. The pulmonary artery shows a normal branching pattern bilaterally. No intraluminal filling defect to suggest pulmonary embolism is seen. Mediastinum/Nodes: Thoracic inlet is within normal limits. No hilar or mediastinal adenopathy is noted. The esophagus as visualized is within normal limits. Lungs/Pleura: Lungs are well aerated bilaterally. No focal infiltrate or effusion is seen. Upper Abdomen: Visualized upper abdomen is within normal limits. Musculoskeletal: No chest wall abnormality. No acute or significant osseous findings. Review of the MIP images confirms the above findings. IMPRESSION: No evidence of pulmonary emboli. No acute abnormality seen. Electronically Signed   By: Alcide Clever M.D.   On: 04/11/2023 23:54   DG Chest Portable 1 View Result Date: 04/11/2023 CLINICAL DATA:  Shortness of breath EXAM: PORTABLE CHEST 1 VIEW COMPARISON:  08/13/2022 FINDINGS: The heart size and mediastinal contours are within normal limits. Both lungs are clear. The visualized skeletal structures are unremarkable. IMPRESSION: No active disease. Electronically Signed   By: Jasmine Pang M.D.   On: 04/11/2023 22:48    Procedures Procedures    Medications Ordered in ED Medications  lactated ringers bolus 1,000 mL (0 mLs Intravenous Stopped 04/12/23 0032)  iohexol (OMNIPAQUE) 350 MG/ML injection 100 mL (100 mLs Intravenous Contrast Given 04/11/23 2336)    ED Course/ Medical Decision Making/ A&P Clinical Course as of  04/12/23 0113  Fri Apr 11, 2023  2221 Creatinine(!): 1.31 At baseline [RP]    Clinical Course User Index [RP] Rondel Baton, MD                                 Medical Decision Making Amount and/or Complexity of Data Reviewed Labs: ordered. Decision-making details documented in ED Course. Radiology: ordered.  Risk Prescription drug management.   Ricky Glass is a 58 y.o. male with comorbidities that complicate the patient evaluation including diabetes, gout, and hypertension who presents emergency department with dizziness and weakness.    Initial Ddx:  Pneumonia, URI, PE, anemia, acidosis, MI  MDM/Course:  Patient presents emergency department with shortness of breath.  Also has had some dizziness and weakness.  Said some nausea.  Just finished a steroid course for his gout.  Not having any other respiratory symptoms.  Not having chest pain, diaphoresis, or vomiting so feel that MI is less likely.  Lab work was obtained which showed that he has a mild leukocytosis.  Also had an elevated D-dimer so underwent a CTA that did not show evidence of PE or pneumonia.  Was given some IV fluids and he reported feeling much better.  Upon reevaluation patient is back to baseline.  Will him follow-up with his primary doctor in several days regarding his symptoms.    This patient presents to the ED for concern of complaints listed in HPI, this involves an extensive number of treatment options, and is a complaint that carries with it a high risk of complications and morbidity. Disposition including potential need for admission considered.   Dispo: DC Home. Return precautions discussed including, but not limited to, those listed in the AVS. Allowed pt time to ask questions which were answered fully prior to dc.  Records reviewed Outpatient Clinic Notes The following labs were independently interpreted: Chemistry and show no acute abnormality I independently reviewed the following imaging  with scope of interpretation limited to determining acute life threatening conditions related to  emergency care: CT Chest and agree with the radiologist interpretation with the following exceptions: none I personally reviewed and interpreted cardiac monitoring: normal sinus rhythm  I personally reviewed and interpreted the pt's EKG: see above for interpretation  I have reviewed the patients home medications and made adjustments as needed  Portions of this note were generated with Dragon dictation software. Dictation errors may occur despite best attempts at proofreading.     Final Clinical Impression(s) / ED Diagnoses Final diagnoses:  Dizziness  Shortness of breath    Rx / DC Orders ED Discharge Orders     None         Rondel Baton, MD 04/12/23 [phone]

## 2023-04-11 NOTE — ED Triage Notes (Signed)
Pt arrives via EMS from work co dizziness and weakness worsening throughout today. Diagnosed with pneumonia a few weeks ago and hospitalized for similar episodes. EMS gave 500NS, 4mg  zofran en route. 269BG

## 2023-04-12 NOTE — Discharge Instructions (Signed)
You were seen for shortness of breath and dizziness in the emergency department.   At home, please stay well-hydrated.    Check your MyChart online for the results of any tests that had not resulted by the time you left the emergency department.   Follow-up with your primary doctor in 2-3 days regarding your visit.    Return immediately to the emergency department if you experience any of the following: Difficulty breathing, chest pain, or any other concerning symptoms.    Thank you for visiting our Emergency Department. It was a pleasure taking care of you today.

## 2023-04-15 ENCOUNTER — Telehealth (INDEPENDENT_AMBULATORY_CARE_PROVIDER_SITE_OTHER): Payer: Self-pay | Admitting: Primary Care

## 2023-04-15 NOTE — Telephone Encounter (Signed)
Called pt to see if interested in doing a virtual atp. Pt would like to make atp virtual.

## 2023-04-15 NOTE — Telephone Encounter (Signed)
 Called to remind pt about atp. Pt will be present.

## 2023-04-16 ENCOUNTER — Telehealth (INDEPENDENT_AMBULATORY_CARE_PROVIDER_SITE_OTHER): Payer: 59 | Admitting: Primary Care

## 2023-04-16 DIAGNOSIS — M1000 Idiopathic gout, unspecified site: Secondary | ICD-10-CM | POA: Diagnosis not present

## 2023-04-16 DIAGNOSIS — G894 Chronic pain syndrome: Secondary | ICD-10-CM | POA: Diagnosis not present

## 2023-04-16 MED ORDER — KETOROLAC TROMETHAMINE 10 MG PO TABS
10.0000 mg | ORAL_TABLET | Freq: Three times a day (TID) | ORAL | 1 refills | Status: AC | PRN
Start: 2023-04-16 — End: ?

## 2023-04-16 NOTE — Progress Notes (Unsigned)
[facility]  Virtual Visit Note  I connected with Ricky Glass, on 04/16/2023 at 4:03 PM through an audio and video application and verified that I am speaking with the correct person using two identifiers.   Consent: I discussed the limitations, risks, security and privacy concerns of performing an evaluation and management service by mychart and the availability of in person appointments. I also discussed with the patient that there may be a patient responsible charge related to this service. The patient expressed understanding and agreed to proceed.   Location of Patient: Home  Location of Provider: [facility]   Persons participating in visit: Kolten R Glass Talaysia Pinheiro,  NP   History of Present Illness: Ricky Glass is a 58 year old male who has been to the ED for dizziness, weakness nausea but no vomiting. Shortness of breath  that is worsened with exertion. Stated he was dx 70-3 years old he had arthritis. Stiff in AM cervical and rt hip. He was going to pain management but had to pay $200 visit - Patient states the only thing that helps him to walk and function is prednisone and oxycodone. Today left hand swollen and peeling.   Past Medical History:  Diagnosis Date   Diabetes mellitus without complication (HCC)    Gout    Hyperlipidemia    Hypertension    Allergies  Allergen Reactions   Aspirin     Childhood reaction  03/26/2023 Pt states he is taking ibuprofen at home without any side effects    Current Outpatient Medications on File Prior to Visit  Medication Sig Dispense Refill   allopurinol (ZYLOPRIM) 100 MG tablet Take 1 tablet (100 mg total) by mouth daily. 90 tablet 1   amLODipine (NORVASC) 10 MG tablet Take 1 tablet (10 mg total) by mouth daily. 90 tablet 1   colchicine 0.6 MG tablet Take 1 tablet (0.6 mg total) by mouth 2 (two) times daily for 10 days. 20 tablet 0   cyanocobalamin  (VITAMIN B12) 1000 MCG tablet Take 1 tablet (1,000 mcg total) by mouth daily. 30 tablet 3   ibuprofen (ADVIL) 800 MG tablet Take 800 mg by mouth 2 (two) times daily.     losartan (COZAAR) 100 MG tablet Take 100 mg by mouth daily.     meloxicam (MOBIC) 15 MG tablet Take 15 mg by mouth daily.     metFORMIN (GLUCOPHAGE-XR) 500 MG 24 hr tablet Take 1 tablet (500 mg total) by mouth daily with breakfast. 90 tablet 1   methocarbamol (ROBAXIN) 500 MG tablet Take 1 tablet (500 mg total) by mouth 2 (two) times daily. 20 tablet 0   ondansetron (ZOFRAN-ODT) 4 MG disintegrating tablet Take 1 tablet (4 mg total) by mouth every 8 (eight) hours as needed for nausea or vomiting. 20 tablet 0   Oxycodone HCl 10 MG TABS Take by mouth.     pravastatin (PRAVACHOL) 40 MG tablet Take 1 tablet (40 mg total) by mouth daily. 90 tablet 3   predniSONE (DELTASONE) 10 MG tablet Take 1-4 tablets (10-40 mg total) by mouth daily. You may take 10-40mg  of prednisone (1-4 tablets) daily. Recommend 40mg  for 4 days (4 tablets), 30mg  for 3 days, 20mg  for 2 days. 60 tablet 0   No current facility-administered medications on file prior to visit.    Observations/Objective: See HPI   Assessment and Plan: Diagnoses and all orders for this visit:  Chronic pain syndrome -  Ambulatory referral to Orthopedic Surgery  Idiopathic gout, unspecified chronicity, unspecified site -     Ambulatory referral to Orthopedic Surgery  Other orders -     ketorolac (TORADOL) 10 MG tablet; Take 1 tablet (10 mg total) by mouth every 8 (eight) hours as needed for severe pain (pain score 7-10).     Follow Up Instructions:    I discussed the assessment and treatment plan with the patient. The patient was provided an opportunity to ask questions and all were answered. The patient agreed with the plan and demonstrated an understanding of the instructions.   The patient was advised to call back or seek an in-person evaluation if the symptoms worsen  or if the condition fails to improve as anticipated.     I provided 25 minutes total time during this encounter including median intraservice time, reviewing previous notes, investigations, ordering medications, medical decision making, coordinating care and patient verbalized understanding at the end of the visit.    This note has been created with Education officer, environmental. Any transcriptional errors are unintentional.   Grayce Sessions, NP 04/16/2023, 4:03 PM

## 2023-06-26 ENCOUNTER — Ambulatory Visit: Payer: Self-pay

## 2023-06-26 NOTE — Telephone Encounter (Signed)
 Copied from CRM [id number]. Topic: Clinical - Red Word Triage >> Jun 26, 2023  2:51 PM Fredrica W wrote: Red Word that prompted transfer to Nurse Triage: Can't barely walk or get out of bed back pain. Medication not working   Stage manager Complaint: Back pain Symptoms: Lower back pain  Frequency: Constant  Disposition: [] ED /[] Urgent Care (no appt availability in office) / [x] Appointment(In office/virtual)/ []  [place] [facility]/ [] Home Care/ [] Refused Recommended Disposition /[] [place] [facility]/ []  Follow-up with PCP Additional Notes: Patient reports he has a history of lower back pain. He states his pain has been worsening and that his pain medications have not been helping. Patient states he has some difficulty walking and getting out of bed in the morning due to his pain. Appointment made for the patient, which was also added to the wait list. Patient instructed to call back for new or worsening symptoms. Patient verbalized understanding and agreement with this plan.     Reason for Disposition  Back pain is a chronic symptom (recurrent or ongoing AND present > 4 weeks)  Answer Assessment - Initial Assessment Questions 1. ONSET: "When did the pain begin?"      Chronic  2. LOCATION: "Where does it hurt?" (upper, mid or lower back)     Lower back  3. SEVERITY: "How bad is the pain?"  (e.g., Scale 1-10; mild, moderate, or severe)   - MILD (1-3): Doesn't interfere with normal activities.    - MODERATE (4-7): Interferes with normal activities or awakens from sleep.    - SEVERE (8-10): Excruciating pain, unable to do any normal activities.      Moderate to severe  4. PATTERN: "Is the pain constant?" (e.g., yes, no; constant, intermittent)      Constant  6. CAUSE:  "What do you think is causing the back pain?"      Chronic problem  7. BACK OVERUSE:  "Any recent lifting of heavy objects, strenuous work or exercise?"     No 8. MEDICINES: "What have you taken so far for the pain?" (e.g.,  nothing, acetaminophen , NSAIDS)     Has been taking prescribed medication without relief  10. OTHER SYMPTOMS: "Do you have any other symptoms?" (e.g., fever, abdomen pain, burning with urination, blood in urine)       Difficulty walking due to pain  Protocols used: Back Pain-A-AH

## 2023-06-26 NOTE — Telephone Encounter (Signed)
 Pt had a virtual visit with provider on 04/16/23. Provider prescribed Toradol  10mg . Will forward to covering provider for any recommendations.  Kellen could you reach out to pt and a schedule a sooner appt with any available provider or refer to mobile. If symptoms gets worse he will need to be seen at ED or Urgent Care

## 2023-07-03 ENCOUNTER — Encounter (HOSPITAL_COMMUNITY): Payer: Self-pay

## 2023-07-09 ENCOUNTER — Telehealth (INDEPENDENT_AMBULATORY_CARE_PROVIDER_SITE_OTHER): Payer: Self-pay | Admitting: Primary Care

## 2023-07-09 NOTE — Telephone Encounter (Signed)
 Called pt to confirm appt. Pt stated that he was going to our mobile unit today and will keep appt as scheduled.

## 2023-07-10 ENCOUNTER — Telehealth (INDEPENDENT_AMBULATORY_CARE_PROVIDER_SITE_OTHER): Payer: Self-pay | Admitting: Primary Care

## 2023-07-10 ENCOUNTER — Ambulatory Visit (INDEPENDENT_AMBULATORY_CARE_PROVIDER_SITE_OTHER): Payer: Self-pay | Admitting: Primary Care

## 2023-07-10 NOTE — Telephone Encounter (Signed)
 Called pt to see if interested in rescheduling missed appt. Pt did not answer. Please advise

## 2023-08-14 ENCOUNTER — Emergency Department (HOSPITAL_COMMUNITY)
Admission: EM | Admit: 2023-08-14 | Discharge: 2023-08-14 | Disposition: A | Discharge status: Home / Self Care | Payer: Self-pay | Attending: Emergency Medicine | Admitting: Emergency Medicine

## 2023-08-14 ENCOUNTER — Encounter (HOSPITAL_COMMUNITY): Payer: Self-pay

## 2023-08-14 ENCOUNTER — Emergency Department (HOSPITAL_COMMUNITY): Payer: Self-pay

## 2023-08-14 DIAGNOSIS — R42 Dizziness and giddiness: Secondary | ICD-10-CM | POA: Insufficient documentation

## 2023-08-14 DIAGNOSIS — I10 Essential (primary) hypertension: Secondary | ICD-10-CM | POA: Insufficient documentation

## 2023-08-14 DIAGNOSIS — Z79899 Other long term (current) drug therapy: Secondary | ICD-10-CM | POA: Insufficient documentation

## 2023-08-14 DIAGNOSIS — E119 Type 2 diabetes mellitus without complications: Secondary | ICD-10-CM | POA: Insufficient documentation

## 2023-08-14 DIAGNOSIS — R079 Chest pain, unspecified: Secondary | ICD-10-CM

## 2023-08-14 DIAGNOSIS — M62838 Other muscle spasm: Secondary | ICD-10-CM

## 2023-08-14 DIAGNOSIS — Z7984 Long term (current) use of oral hypoglycemic drugs: Secondary | ICD-10-CM | POA: Insufficient documentation

## 2023-08-14 DIAGNOSIS — D72829 Elevated white blood cell count, unspecified: Secondary | ICD-10-CM | POA: Insufficient documentation

## 2023-08-14 DIAGNOSIS — R0602 Shortness of breath: Secondary | ICD-10-CM | POA: Insufficient documentation

## 2023-08-14 LAB — BASIC METABOLIC PANEL WITH GFR
Anion gap: 7 (ref 5–15)
BUN: 19 mg/dL (ref 6–20)
CO2: 22 mmol/L (ref 22–32)
Calcium: 8.7 mg/dL — ABNORMAL LOW (ref 8.9–10.3)
Chloride: 106 mmol/L (ref 98–111)
Creatinine, Ser: 1.24 mg/dL (ref 0.61–1.24)
GFR, Estimated: >60 mL/min (ref >60)
Glucose, Bld: 157 mg/dL — ABNORMAL HIGH (ref 70–99)
Potassium: 4.1 mmol/L (ref 3.5–5.1)
Sodium: 135 mmol/L (ref 135–145)

## 2023-08-14 LAB — CBC
HCT: 40.7 % (ref 39.0–52.0)
Hemoglobin: 12.9 g/dL — ABNORMAL LOW (ref 13.0–17.0)
MCH: 26.7 pg (ref 26.0–34.0)
MCHC: 31.7 g/dL (ref 30.0–36.0)
MCV: 84.3 fL (ref 80.0–100.0)
Platelets: 236 10*3/uL (ref 150–400)
RBC: 4.83 MIL/uL (ref 4.22–5.81)
RDW: 13.9 % (ref 11.5–15.5)
WBC: 14.7 10*3/uL — ABNORMAL HIGH (ref 4.0–10.5)
nRBC: 0 % (ref 0.0–0.2)

## 2023-08-14 LAB — TROPONIN I (HIGH SENSITIVITY)
Troponin I (High Sensitivity): 3 ng/L (ref <18)
Troponin I (High Sensitivity): 3 ng/L (ref <18)

## 2023-08-14 LAB — D-DIMER, QUANTITATIVE: D-Dimer, Quant: 0.63 ug{FEU}/mL — ABNORMAL HIGH (ref 0.00–0.50)

## 2023-08-14 MED ORDER — LIDOCAINE 5 % EX PTCH: 1.0000 | MEDICATED_PATCH | CUTANEOUS | 0 refills | Status: AC

## 2023-08-14 MED ORDER — HYDROCODONE-ACETAMINOPHEN 5-325 MG PO TABS: 1.0000 | ORAL_TABLET | ORAL | 0 refills | Status: AC | PRN

## 2023-08-14 MED ORDER — IOHEXOL 350 MG/ML SOLN
75.0000 mL | Freq: Once | INTRAVENOUS | Status: AC | PRN
  Administered 2023-08-14: 75 mL via INTRAVENOUS

## 2023-08-14 MED ORDER — KETOROLAC TROMETHAMINE 15 MG/ML IJ SOLN
15.0000 mg | Freq: Once | INTRAMUSCULAR | Status: AC
  Administered 2023-08-14: 15 mg via INTRAVENOUS
  Filled 2023-08-14: qty 1

## 2023-08-14 MED ORDER — CYCLOBENZAPRINE HCL 10 MG PO TABS: 10.0000 mg | ORAL_TABLET | Freq: Two times a day (BID) | ORAL | 0 refills | Status: AC | PRN

## 2023-08-14 NOTE — ED Triage Notes (Addendum)
 Pt c/o sharp and squeezing L side chest, L shoulder, and L arm pain after mowing grass this morning.  Pain score 8/10.  Pt reports L arm feels heavy.  Pt is able to move arm, but sts it is painful w/ movement.   Pt reports getting routine cortisone shots in his L shoulder.  Sts his last shot was 12/2022.   Stroke screen negative.

## 2023-08-14 NOTE — ED Provider Triage Note (Signed)
 Emergency Medicine Provider Triage Evaluation Note  Ricky Glass , a 58 y.o. male  was evaluated in triage.  Pt complains of left side CP today with left arm heaviness while mowing the grass today (self propelled mower). Pain is still present. Mild SHOB on arrival in the ER. No nausea. Denies diaphoresis.  Legs feel tight. Went to PCP yesterday to get a cortisone shot in the left shoulder but PCP was out of town.  Usually walks with crutches due to back pain, worse in the AM. Took Oxy for pain today. Review of Systems  Positive:  Negative:   Physical Exam  BP 110/78 (BP Location: Left Arm)   Pulse 85   Temp 98.8 F (37.1 C) (Oral)   Resp 16   Ht 5' 9 (1.753 m)   Wt 81.6 kg   SpO2 100%   BMI 26.58 kg/m  Gen:   Awake, no distress   Resp:  Normal effort  MSK:     Other:    Medical Decision Making  Medically screening exam initiated at 11:52 AM.  Appropriate orders placed.  Ricky Glass was informed that the remainder of the evaluation will be completed by another provider, this initial triage assessment does not replace that evaluation, and the importance of remaining in the ED until their evaluation is complete.     Darlis Eisenmenger, PA-C 08/14/23 1156

## 2023-08-14 NOTE — ED Provider Notes (Signed)
 [place] EMERGENCY DEPARTMENT AT [facility] Provider Note   CSN: [id number] Arrival date & time: 08/14/23  1111     Patient presents with: Chest Pain, Shortness of Breath, and Arm Pain   Ricky Glass is a 58 y.o. male.   The history is provided by the patient and medical records. No language interpreter was used.  Chest Pain Pain location:  L chest Pain quality: aching, crushing, dull and pressure   Pain radiates to:  L shoulder Pain severity:  Severe Onset quality:  Gradual Duration:  1 day Timing:  Constant Progression:  Improving Chronicity:  New Relieved by:  Rest and aspirin Worsened by:  Exertion and deep breathing Ineffective treatments:  None tried Associated symptoms: fatigue and shortness of breath   Associated symptoms: no abdominal pain, no altered mental status, no anxiety, no back pain, no cough, no fever, no headache, no lower extremity edema, no nausea, no near-syncope, no numbness, no palpitations, no vomiting and no weakness   Risk factors: diabetes mellitus, high cholesterol and hypertension   Risk factors: no coronary artery disease and no prior DVT/PE   Shortness of Breath Severity:  Moderate Associated symptoms: chest pain   Associated symptoms: no abdominal pain, no cough, no fever, no headaches, no neck pain, no rash, no vomiting and no wheezing   Risk factors: no family hx of DVT and no hx of PE/DVT   Arm Pain Associated symptoms include chest pain and shortness of breath. Pertinent negatives include no abdominal pain and no headaches.       Prior to Admission medications   Medication Sig Start Date End Date Taking? Authorizing Provider  allopurinol  (ZYLOPRIM ) 100 MG tablet Take 1 tablet (100 mg total) by mouth daily. 04/15/19   Marius Siemens, NP  amLODipine  (NORVASC ) 10 MG tablet Take 1 tablet (10 mg total) by mouth daily. 05/25/21   Marius Siemens, NP  colchicine  0.6 MG tablet Take 1 tablet (0.6 mg total) by mouth 2  (two) times daily for 10 days. 05/12/19 05/22/19  Marius Siemens, NP  cyanocobalamin  (VITAMIN B12) 1000 MCG tablet Take 1 tablet (1,000 mcg total) by mouth daily. 02/08/22   Thayil, Irene T, PA-C  ketorolac  (TORADOL ) 10 MG tablet Take 1 tablet (10 mg total) by mouth every 8 (eight) hours as needed for severe pain (pain score 7-10). 04/16/23   Marius Siemens, NP  losartan (COZAAR) 100 MG tablet Take 100 mg by mouth daily. 01/30/22   [provider]  metFORMIN  (GLUCOPHAGE -XR) 500 MG 24 hr tablet Take 1 tablet (500 mg total) by mouth daily with breakfast. 05/25/21   Marius Siemens, NP  methocarbamol  (ROBAXIN ) 500 MG tablet Take 1 tablet (500 mg total) by mouth 2 (two) times daily. 07/19/22   Harlow Lighter, [place]  N, FNP  ondansetron  (ZOFRAN -ODT) 4 MG disintegrating tablet Take 1 tablet (4 mg total) by mouth every 8 (eight) hours as needed for nausea or vomiting. 03/26/23   Rexie Catena, PA-C  Oxycodone HCl 10 MG TABS Take by mouth.    [provider]  pravastatin  (PRAVACHOL ) 40 MG tablet Take 1 tablet (40 mg total) by mouth daily. 04/15/19   Marius Siemens, NP  predniSONE  (DELTASONE ) 10 MG tablet Take 1-4 tablets (10-40 mg total) by mouth daily. You may take 10-40mg  of prednisone  (1-4 tablets) daily. Recommend 40mg  for 4 days (4 tablets), 30mg  for 3 days, 20mg  for 2 days. 03/26/23   Scarlette Currier, MD    Allergies: Aspirin  Review of Systems  Constitutional:  Positive for fatigue. Negative for chills and fever.  HENT:  Negative for congestion.   Respiratory:  Positive for shortness of breath. Negative for cough, chest tightness and wheezing.   Cardiovascular:  Positive for chest pain. Negative for palpitations and near-syncope.  Gastrointestinal:  Negative for abdominal pain, constipation, diarrhea, nausea and vomiting.  Genitourinary:  Negative for dysuria and flank pain.  Musculoskeletal:  Negative for back pain, neck pain and neck stiffness.  Skin:  Negative  for rash and wound.  Neurological:  Positive for light-headedness. Negative for syncope, weakness, numbness and headaches.  Psychiatric/Behavioral:  Negative for agitation and confusion.   All other systems reviewed and are negative.   Updated Vital Signs BP (!) 158/91 (BP Location: Right Arm)   Pulse 68   Temp 98.1 F (36.7 C) (Oral)   Resp 18   Ht 5' 9 (1.753 m)   Wt 81.6 kg   SpO2 100%   BMI 26.58 kg/m   Physical Exam Vitals and nursing note reviewed.  Constitutional:      General: He is not in acute distress.    Appearance: He is well-developed. He is not ill-appearing, toxic-appearing or diaphoretic.  HENT:     Head: Normocephalic and atraumatic.   Eyes:     Conjunctiva/sclera: Conjunctivae normal.     Pupils: Pupils are equal, round, and reactive to light.    Cardiovascular:     Rate and Rhythm: Normal rate and regular rhythm.     Heart sounds: Normal heart sounds. No murmur heard. Pulmonary:     Effort: Pulmonary effort is normal. No respiratory distress.     Breath sounds: Normal breath sounds. No wheezing, rhonchi or rales.  Chest:     Chest wall: Tenderness present.  Abdominal:     Palpations: Abdomen is soft.     Tenderness: There is no abdominal tenderness.   Musculoskeletal:        General: No swelling.     Cervical back: Neck supple.     Right lower leg: No tenderness. No edema.     Left lower leg: No tenderness. No edema.   Skin:    General: Skin is warm and dry.     Capillary Refill: Capillary refill takes less than 2 seconds.   Neurological:     General: No focal deficit present.     Mental Status: He is alert.   Psychiatric:        Mood and Affect: Mood normal.     (all labs ordered are listed, but only abnormal results are displayed) Labs Reviewed  BASIC METABOLIC PANEL WITH GFR - Abnormal; Notable for the following components:      Result Value   Glucose, Bld 157 (*)    Calcium 8.7 (*)    All other components within normal  limits  CBC - Abnormal; Notable for the following components:   WBC 14.7 (*)    Hemoglobin 12.9 (*)    All other components within normal limits  D-DIMER, QUANTITATIVE - Abnormal; Notable for the following components:   D-Dimer, Quant 0.63 (*)    All other components within normal limits  BRAIN NATRIURETIC PEPTIDE  TROPONIN I (HIGH SENSITIVITY)  TROPONIN I (HIGH SENSITIVITY)    EKG: EKG Interpretation Date/Time:  Thursday August 14 2023 11:19:34 EDT Ventricular Rate:  86 PR Interval:  131 QRS Duration:  87 QT Interval:  346 QTC Calculation: 414 R Axis:   52  Text Interpretation: Sinus rhythm Probable  anteroseptal infarct, old when compared to prior, more wandering baseline No STEMI Confirmed by Wynell Heath ([id number]) on 08/14/2023 3:05:29 PM  Radiology: CT Angio Chest PE W and/or Wo Contrast Result Date: 08/14/2023 CLINICAL DATA:  Pleuritic chest pain and shortness of breath. EXAM: CT ANGIOGRAPHY CHEST WITH CONTRAST TECHNIQUE: Multidetector CT imaging of the chest was performed using the standard protocol during bolus administration of intravenous contrast. Multiplanar CT image reconstructions and MIPs were obtained to evaluate the vascular anatomy. RADIATION DOSE REDUCTION: This exam was performed according to the departmental dose-optimization program which includes automated exposure control, adjustment of the mA and/or kV according to patient size and/or use of iterative reconstruction technique. CONTRAST:  75mL OMNIPAQUE  IOHEXOL  350 MG/ML SOLN COMPARISON:  April 11, 2023 FINDINGS: Cardiovascular: The thoracic aorta is normal in appearance. Satisfactory opacification of the pulmonary arteries to the segmental level. No evidence of pulmonary embolism. Normal heart size. No pericardial effusion. Mediastinum/Nodes: No enlarged mediastinal, hilar, or axillary lymph nodes. Thyroid gland, trachea, and esophagus demonstrate no significant findings. Lungs/Pleura: Mild lingular, right middle  lobe and bilateral lower lobe atelectatic changes are seen. There is no evidence of acute infiltrate, pleural effusion or pneumothorax. Upper Abdomen: No acute abnormality. Musculoskeletal: No chest wall abnormality. No acute or significant osseous findings. Review of the MIP images confirms the above findings. IMPRESSION: 1. No evidence of pulmonary embolism or other acute intrathoracic process. 2. Mild lingular, right middle lobe and bilateral lower lobe atelectatic changes. Electronically Signed   By: Virgle Grime M.D.   On: 08/14/2023 20:45   DG Chest 2 View Result Date: 08/14/2023 CLINICAL DATA:  Chest and left arm pain EXAM: CHEST - 2 VIEW COMPARISON:  04/11/2023 FINDINGS: The heart size and mediastinal contours are within normal limits. Both lungs are clear. The visualized skeletal structures are unremarkable. IMPRESSION: No active cardiopulmonary disease. Electronically Signed   By: Bobbye Burrow M.D.   On: 08/14/2023 13:18     Procedures   Medications Ordered in the ED  ketorolac  (TORADOL ) 15 MG/ML injection 15 mg (has no administration in time range)  iohexol  (OMNIPAQUE ) 350 MG/ML injection 75 mL (75 mLs Intravenous Contrast Given 08/14/23 2013)                                    Medical Decision Making Amount and/or Complexity of Data Reviewed Labs: ordered. Radiology: ordered.  Risk Prescription drug management.    Ricky Glass is a 58 y.o. male with a past medical history significant for hypertension, hyperlipidemia, diabetes, gout, and chronic bilateral lower extremity pains, low back pain, left shoulder pain, left arm pains who presents with chest pain.  According to patient, for months he is been having symptoms in his low back and legs and left arm for which he is gotten injections for.  He says that he was mowing his grass this morning and started having pressure and aching in his left chest.  He reports it was in his left chest and went towards the shoulder.  He  reports his arm symptoms were slightly worse during it.  He reports he was fatigued and short of breath but did not have nausea or vomiting.  He was not diaphoretic.  He denies any history of blood clots and has had no leg swelling recently.  He reports he was exerting himself mowing but was not using his arms forcefully.  No mechanical injuries reported.  Does  not report rash to suggest shingles.  He says the symptoms were both exertional and quite pleuritic and were 10 out of 10 severity initially.  It is now improved after he took some aspirin at home.  On exam, lungs clear.  Chest is quite tender to palpation.  Intact sensation strength and pulses in arms and legs.  Neck nontender.  Back not focally tender in the midline.  Some paraspinal low back tenderness reports is chronic.  Abdomen nontender with good bowel sounds.  No murmur.  EKG appears similar to prior with a wandering baseline but no STEMI.  Given the patient's extremely pleuritic symptoms, will get a D-dimer although he did have a negative PE study 5 months ago.  Patient had some screening workup in triage that showed leukocytosis but initial troponin was negative, will trend.  Will add on a BNP and get the second troponin.  Chest x-ray was obtained and was initially reassuring.  Clinically suspect more musculoskeletal symptoms however we will get the other labs and place the patient on cardiac monitoring.  Anticipate reassessment after workup to determine disposition.  If workup reassuring, anticipate outpatient cardiology follow-up and PCP follow-up for his chronic pain management.       Final diagnoses:  Left-sided chest pain  Muscle spasm   Clinical Impression: 1. Left-sided chest pain   2. Muscle spasm     Disposition: Discharge  Condition: Good  I have discussed the results, Dx and Tx plan with the pt(& family if present). He/she/they expressed understanding and agree(s) with the plan. Discharge instructions  discussed at great length. Strict return precautions discussed and pt &/or family have verbalized understanding of the instructions. No further questions at time of discharge.    New Prescriptions   CYCLOBENZAPRINE  (FLEXERIL ) 10 MG TABLET    Take 1 tablet (10 mg total) by mouth 2 (two) times daily as needed for muscle spasms.   HYDROCODONE -ACETAMINOPHEN  (NORCO/VICODIN) 5-325 MG TABLET    Take 1 tablet by mouth every 4 (four) hours as needed.   LIDOCAINE  (LIDODERM ) 5 %    Place 1 patch onto the skin daily. Remove & Discard patch within 12 hours or as directed by MD    Follow Up: your orthopedics team          Wava Kildow, Marine Sia, MD 08/14/23 2221

## 2023-08-14 NOTE — Discharge Instructions (Signed)
 Your history, exam, workup today did not show evidence of blood clot or a cardiac cause of your symptoms.  Given the tenderness and pain with movement I suspect it is more musculoskeletal.  We agreed together to give you prescription for short course of some pain medicine, muscle medicine, and numbing patches and IV follow-up with the orthopedics team.  Given your reassuring vital signs and workup we feel you are safe for discharge home.  Please rest and follow-up with your primary doctor.  If any symptoms change or worsen acutely, please return to the nearest emergency department.

## 2023-08-20 ENCOUNTER — Other Ambulatory Visit (INDEPENDENT_AMBULATORY_CARE_PROVIDER_SITE_OTHER): Payer: Self-pay | Admitting: Primary Care

## 2023-08-20 NOTE — Telephone Encounter (Unsigned)
 Copied from CRM [id number]. Topic: Clinical - Medication Refill >> Aug 20, 2023  2:00 PM Yolanda T wrote: Medication: predniSONE  (DELTASONE ) 10 MG tablet  Has the patient contacted their pharmacy? No Patient said he is having a gout flare up and prednisone  is the only med that will help with this symptoms  This is the patient's preferred pharmacy:   [id number] - [place], [place] - [address] AT [facility] [address] [place] [id number] Phone: [phone] Fax: [phone]  Is this the correct pharmacy for this prescription? Yes  Has the prescription been filled recently? No  Is the patient out of the medication? Yes  Has the patient been seen for an appointment in the last year OR does the patient have an upcoming appointment? Yes  Can we respond through MyChart? Yes  Agent: Please be advised that Rx refills may take up to 3 business days. We ask that you follow-up with your pharmacy.

## 2023-08-21 NOTE — Telephone Encounter (Signed)
 Requested medication (s) are due for refill today: Yes  Requested medication (s) are on the active medication list: Yes  Last refill:  03/26/23  Future visit scheduled: No  Notes to clinic:  Unable to refill per protocol, cannot delegate.      Requested Prescriptions  Pending Prescriptions Disp Refills   predniSONE  (DELTASONE ) 10 MG tablet 60 tablet 0    Sig: Take 1-4 tablets (10-40 mg total) by mouth daily. You may take 10-40mg  of prednisone  (1-4 tablets) daily. Recommend 40mg  for 4 days (4 tablets), 30mg  for 3 days, 20mg  for 2 days.     Not Delegated - Endocrinology:  Oral Corticosteroids Failed - 08/21/2023  4:01 PM      Failed - This refill cannot be delegated      Failed - Manual Review: Eye exam for IOP if prolonged treatment      Failed - Glucose (serum) in normal range and within 180 days    Glucose, Bld  Date Value Ref Range Status  08/14/2023 157 (H) 70 - 99 mg/dL Final    Comment:    Glucose reference range applies only to samples taken after fasting for at least 8 hours.   Glucose-Capillary  Date Value Ref Range Status  10/08/2018 187 (H) 70 - 99 mg/dL Final         Failed - Last BP in normal range    BP Readings from Last 1 Encounters:  08/14/23 (!) 162/118         Failed - Bone Mineral Density or Dexa Scan completed in the last 2 years      Passed - K in normal range and within 180 days    Potassium  Date Value Ref Range Status  08/14/2023 4.1 3.5 - 5.1 mmol/L Final         Passed - Na in normal range and within 180 days    Sodium  Date Value Ref Range Status  08/14/2023 135 135 - 145 mmol/L Final  05/25/2021 137 134 - 144 mmol/L Final         Passed - Valid encounter within last 6 months    Recent Outpatient Visits           4 months ago Chronic pain syndrome   [place] [facility] Celestia Rosaline SQUIBB, NP   2 years ago Type 2 diabetes mellitus without complication, without long-term current use of insulin  (HCC)   [facility] Celestia Rosaline SQUIBB, NP   4 years ago Essential hypertension   [place] [facility] Celestia Rosaline SQUIBB, NP   4 years ago Essential hypertension   [place] [facility] Celestia Rosaline SQUIBB, NP   4 years ago Type 2 diabetes mellitus without complication, without long-term current use of insulin  [facility])   [place] [facility] Celestia Rosaline SQUIBB, NP

## 2023-09-12 ENCOUNTER — Ambulatory Visit: Payer: Self-pay

## 2023-09-12 NOTE — Telephone Encounter (Signed)
 FYI Only or Action Required?: Action required by provider: patient requesting prednisone  refill and allopurinol  refill.  Patient was last seen in primary care on 04/16/2023 by Celestia Rosaline SQUIBB, NP.  Called Nurse Triage reporting Hand Pain and Knee Pain.  Symptoms began 08/20/2023.  Interventions attempted: OTC medications: ibuprofen , Prescription medications: allopurinol , and Rest, hydration, or home remedies.  Symptoms are: unchanged.  Triage Disposition: See HCP Within 4 Hours (Or PCP Triage)-patient is asking for medications to help with gout flare-up.  Patient/caregiver understands and will follow disposition?: No, wishes to speak with PCP  Copied from CRM [id number]. Topic: Clinical - Red Word Triage >> Sep 12, 2023 10:04 AM Ricky Glass wrote: Red Word that prompted transfer to Nurse Triage: right hand and arm swollen and numb, right knee and toe swollen and painful- pain level 10+ Reason for Disposition  [1] SEVERE pain AND [2] not improved 2 hours after pain medicine  [1] SEVERE pain (e.g., excruciating, unable to use hand at all) AND [2] not improved after 2 hours of pain medicine  [1] SEVERE pain (e.g., excruciating, unable to walk) AND [2] not improved after 2 hours of pain medicine  Answer Assessment - Initial Assessment Questions 1. ONSET: When did the pain start?     08/20/2023 2. LOCATION: Where is the pain located?     Right hand and arm pain 3. PAIN: How bad is the pain? (Scale 1-10; or mild, moderate, severe)     10 4. WORK OR EXERCISE: Has there been any recent work or exercise that involved this part (i.e., hand or wrist) of the body?     no 5. CAUSE: What do you think is causing the pain?     Gout 6. AGGRAVATING FACTORS: What makes the pain worse? (e.g., using computer)     Trying to work causes more pain 7. OTHER SYMPTOMS: Do you have any other symptoms? (e.g., fever, neck pain, numbness or tingling, rash, swelling)     Swelling to hand, Elbow  pain, knee pain  Answer Assessment - Initial Assessment Questions 1. ONSET: When did the pain start?     08/20/2023 2. LOCATION: Where is the pain located?     Right elbow 3. PAIN: How bad is the pain? (Scale 1-10; or mild, moderate, severe)     10 4. WORK OR EXERCISE: Has there been any recent work or exercise that involved this part of the body?     Patient does work and had to be sent home due to pain.  5. CAUSE: What do you think is causing the elbow pain?     Gout 6. OTHER SYMPTOMS: Do you have any other symptoms? (e.g., neck pain, elbow swelling, rash, fever)     Elbow swelling  Patient requesting prednisone  prescription for gout. Patient reports swelling to hand, elbow and knee on the right side. Patient states this has been going on since 08/20/2023.  Patient states he also needs a new prescription for allopurinol -only has a few pills left.  Answer Assessment - Initial Assessment Questions 1. LOCATION: Where is the swelling located?  (e.g., left, right, both knees)     Right knee 2. ONSET: When did the swelling start? Does it come and go, or is it there all the time?     Started 08/20/2023 3. SWELLING: How bad is the swelling? Or, How large is it? (e.g., mild, moderate, severe; size of localized swelling)      Moderate swelling 4. PAIN: Is there any pain? If Yes,  ask: How bad is it? (Scale 0-10; or none, mild, moderate, severe)     10 5. SETTING: Has there been any recent work, exercise or other activity that involved that part of the body?      Possible gout 6. AGGRAVATING FACTORS: What makes the knee swelling worse? (e.g., walking, climbing stairs, running)     walking 7. ASSOCIATED SYMPTOMS: Is there any pain or redness?     pain 8. OTHER SYMPTOMS: Do you have any other symptoms? (e.g., calf pain, chest pain, difficulty breathing, fever)     no  Protocols used: Hand Pain-A-AH, Elbow Pain-A-AH, Knee Swelling-A-AH

## 2023-09-12 NOTE — Telephone Encounter (Signed)
 Noted. Pt will need to wait for appt with provider.  Video visit- 04/16/23 Last in person visit 05/25/21

## 2023-09-24 ENCOUNTER — Telehealth (INDEPENDENT_AMBULATORY_CARE_PROVIDER_SITE_OTHER): Payer: Self-pay | Admitting: Primary Care

## 2023-09-24 NOTE — Telephone Encounter (Signed)
 Called pt to confirm appt pt will be present.

## 2023-09-25 ENCOUNTER — Telehealth (INDEPENDENT_AMBULATORY_CARE_PROVIDER_SITE_OTHER): Payer: Self-pay | Admitting: Primary Care

## 2023-09-25 ENCOUNTER — Ambulatory Visit (INDEPENDENT_AMBULATORY_CARE_PROVIDER_SITE_OTHER): Payer: Self-pay | Admitting: Primary Care

## 2023-09-25 ENCOUNTER — Ambulatory Visit: Payer: Self-pay

## 2023-09-25 DIAGNOSIS — M1000 Idiopathic gout, unspecified site: Secondary | ICD-10-CM

## 2023-09-25 MED ORDER — PREDNISONE 10 MG PO TABS: 10.0000 mg | ORAL_TABLET | Freq: Every day | ORAL | 0 refills | Status: AC

## 2023-09-25 NOTE — Progress Notes (Signed)
 [facility]  Virtual Visit Note  I connected with Ricky Glass, on 09/25/2023 at 12:16 PM through an audio and video application and verified that I am speaking with the correct person using two identifiers.   Consent: I discussed the limitations, risks, security and privacy concerns of performing an evaluation and management service by mychart and the availability of in person appointments. I also discussed with the patient that there may be a patient responsible charge related to this service. The patient expressed understanding and agreed to proceed.   Location of Patient: home  Location of Provider: [facility]   Persons participating in visit: Ricky Glass Ricky Nevins,  NP   History of Present Illness: [name] out of  work 2 weeks than rtn back to work 1/2 days - Syncope 08/20/23 felt very hot and hit the floor at work co workers put ice on him and a fan in 20 mins he was fine. Gout bilateral right > left  swollen up now. Painful warm.   Past Medical History:  Diagnosis Date   Diabetes mellitus without complication (HCC)    Gout    Hyperlipidemia    Hypertension    Allergies  Allergen Reactions   Aspirin     Childhood reaction  03/26/2023 Pt states he is taking ibuprofen  at home without any side effects    Current Outpatient Medications on File Prior to Visit  Medication Sig Dispense Refill   allopurinol  (ZYLOPRIM ) 100 MG tablet Take 1 tablet (100 mg total) by mouth daily. 90 tablet 1   amLODipine  (NORVASC ) 10 MG tablet Take 1 tablet (10 mg total) by mouth daily. 90 tablet 1   colchicine  0.6 MG tablet Take 1 tablet (0.6 mg total) by mouth 2 (two) times daily for 10 days. 20 tablet 0   cyanocobalamin  (VITAMIN B12) 1000 MCG tablet Take 1 tablet (1,000 mcg total) by mouth daily. 30 tablet 3   cyclobenzaprine  (FLEXERIL ) 10 MG tablet Take 1 tablet (10 mg total) by mouth 2 (two) times  daily as needed for muscle spasms. 20 tablet 0   HYDROcodone -acetaminophen  (NORCO/VICODIN) 5-325 MG tablet Take 1 tablet by mouth every 4 (four) hours as needed. 10 tablet 0   ketorolac  (TORADOL ) 10 MG tablet Take 1 tablet (10 mg total) by mouth every 8 (eight) hours as needed for severe pain (pain score 7-10). 60 tablet 1   lidocaine  (LIDODERM ) 5 % Place 1 patch onto the skin daily. Remove & Discard patch within 12 hours or as directed by MD 15 patch 0   losartan (COZAAR) 100 MG tablet Take 100 mg by mouth daily.     metFORMIN  (GLUCOPHAGE -XR) 500 MG 24 hr tablet Take 1 tablet (500 mg total) by mouth daily with breakfast. 90 tablet 1   methocarbamol  (ROBAXIN ) 500 MG tablet Take 1 tablet (500 mg total) by mouth 2 (two) times daily. 20 tablet 0   ondansetron  (ZOFRAN -ODT) 4 MG disintegrating tablet Take 1 tablet (4 mg total) by mouth every 8 (eight) hours as needed for nausea or vomiting. 20 tablet 0   Oxycodone HCl 10 MG TABS Take by mouth.     pravastatin  (PRAVACHOL ) 40 MG tablet Take 1 tablet (40 mg total) by mouth daily. 90 tablet 3   predniSONE  (DELTASONE ) 10 MG tablet Take 1-4 tablets (10-40 mg total) by mouth daily. You may take 10-40mg  of prednisone  (1-4 tablets) daily. Recommend 40mg  for 4 days (4 tablets), 30mg  for  3 days, 20mg  for 2 days. 60 tablet 0   No current facility-administered medications on file prior to visit.    Observations/Objective: See HPI  Assessment and Plan: Diagnoses and all orders for this visit:  Idiopathic gout, unspecified chronicity, unspecified site -     predniSONE  (DELTASONE ) 10 MG tablet; Take 1-4 tablets (10-40 mg total) by mouth daily. You may take 10-40mg  of prednisone  (1-4 tablets) daily. Recommend 40mg  for 4 days (4 tablets), 30mg  for 3 days, 20mg  for 2 days.     Follow Up Instructions: 6 weeks   I discussed the assessment and treatment plan with the patient. The patient was provided an opportunity to ask questions and all were answered. The  patient agreed with the plan and demonstrated an understanding of the instructions.   The patient was advised to call back or seek an in-person evaluation if the symptoms worsen or if the condition fails to improve as anticipated.     I provided 20 minutes total time during this encounter including median intraservice time, reviewing previous notes, investigations, ordering medications, medical decision making, coordinating care and patient verbalized understanding at the end of the visit.    This note has been created with Education officer, environmental. Any transcriptional errors are unintentional.   Ricky SHAUNNA Bohr, NP 09/25/2023, 12:16 PM

## 2023-09-25 NOTE — Telephone Encounter (Signed)
 Routed incorrectly by accident, routing to correct office.

## 2023-09-25 NOTE — Telephone Encounter (Signed)
 FYI Only or Action Required?: FYI only for provider.  Patient was last seen in primary care on 04/16/2023 by Celestia Rosaline SQUIBB, NP.  Called Nurse Triage reporting Joint Swelling.  Symptoms began 2 days ago for knee.  Interventions attempted: Rest, hydration, or home remedies.  Symptoms are: gradually worsening.  Triage Disposition: See HCP Within 4 Hours (Or PCP Triage)  Patient/caregiver understands and will follow disposition?: Yes   Patient had an appointment in the office today that he cancelled due to difficulty walking, states similar symptoms in the past with gout. Virtual appointment made for today.     Copied from CRM #[id number]. Topic: Clinical - Red Word Triage >> Sep 25, 2023 10:09 AM Rosaria BRAVO wrote: Red Word that prompted transfer to Nurse Triage: Right knee swollen, cannot walk    Reason for Disposition  [1] SEVERE pain (e.g., excruciating, unable to walk) AND [2] not improved after 2 hours of pain medicine  Answer Assessment - Initial Assessment Questions 1. LOCATION: Where is the swelling located?  (e.g., left, right, both knees)     Right knee 2. ONSET: When did the swelling start? Does it come and go, or is it there all the time?     2 days ago  3. SWELLING: How bad is the swelling? Or, How large is it? (e.g., mild, moderate, severe; size of localized swelling)      I can barely walk or bend it 4. PAIN: Is there any pain? If Yes, ask: How bad is it? (Scale 0-10; or none, mild, moderate, severe)     Moderate to severe  5. SETTING: Has there been any recent work, exercise or other activity that involved that part of the body?      No 6. AGGRAVATING FACTORS: What makes the knee swelling worse? (e.g., walking, climbing stairs, running)     Walking 7. ASSOCIATED SYMPTOMS: Is there any pain or redness?     Pain to knee  8. OTHER SYMPTOMS: Do you have any other symptoms? (e.g., calf pain, chest pain, difficulty breathing, fever)      Hand swelling which is slowly improving  Protocols used: Knee Swelling-A-AH

## 2023-11-01 IMAGING — MR MR CERVICAL SPINE W/O CM
5 series · 36 of 48 positions shown · non-contrast
Comparison: None.

CLINICAL DATA: Myelopathy, acute, progressive; left upper extremity
weakness

EXAM:
MRI CERVICAL SPINE WITHOUT CONTRAST
TECHNIQUE: Multiplanar, multisequence MR imaging of the cervical spine was
performed. No intravenous contrast was administered.

[Series 5: T2 · sagittal · 3.0mm · 0.69mm/px · 5 of 15 slices shown (1 of 2)]
[im 1/15]
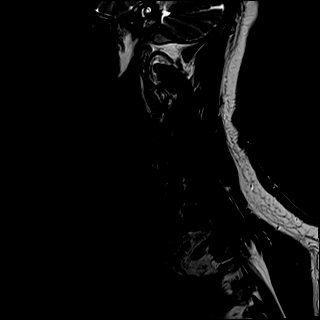
[im 4/15]
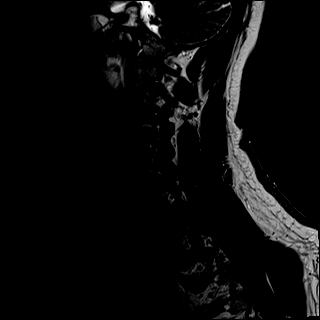
[im 8/15]
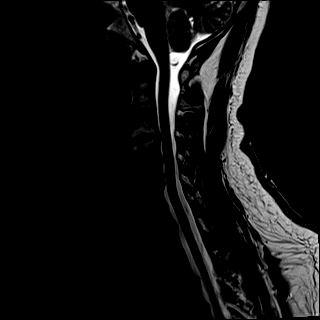
[im 11/15]
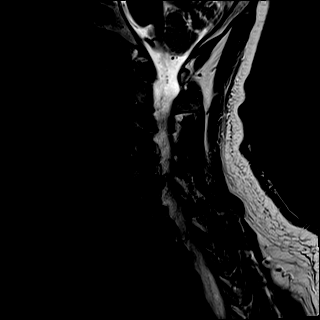
[im 15/15]
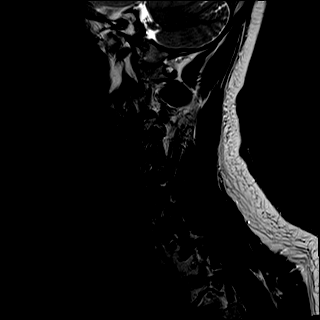

[Series 6: T1 · sagittal · 3.0mm · 0.69mm/px · 6 of 15 slices shown]
[im 1/15]
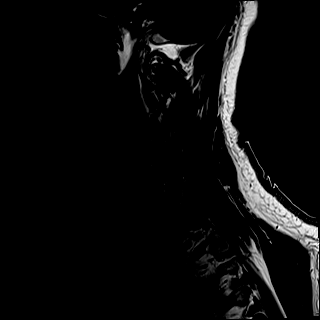
[im 3/15]
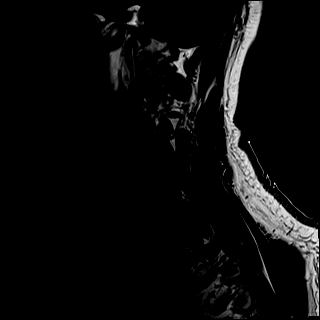
[im 6/15]
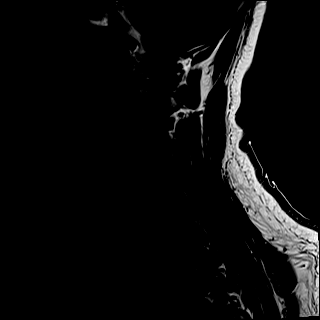
[im 9/15]
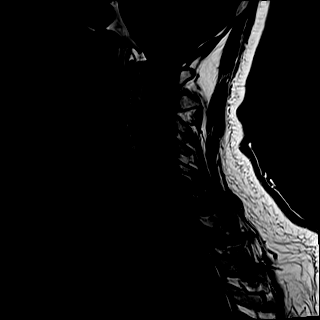
[im 12/15]
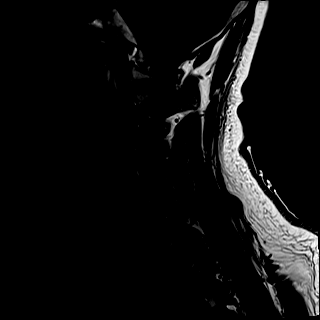
[im 15/15]
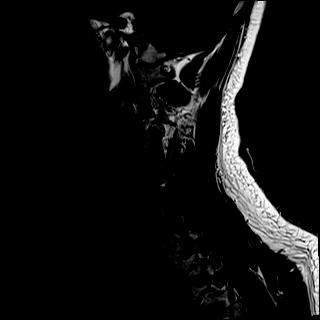

[Series 7: STIR · sagittal · 3.0mm · 0.86mm/px · 6 of 15 slices shown]
[im 1/15]
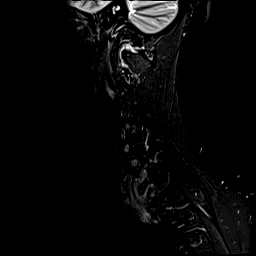
[im 3/15]
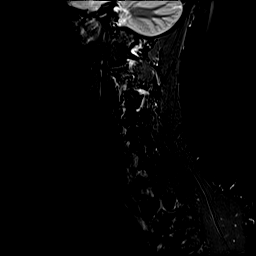
[im 6/15]
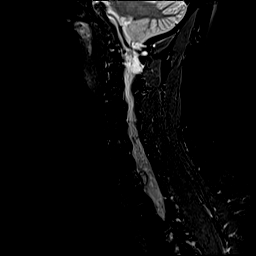
[im 9/15]
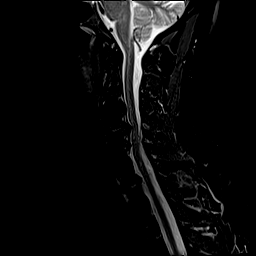
[im 12/15]
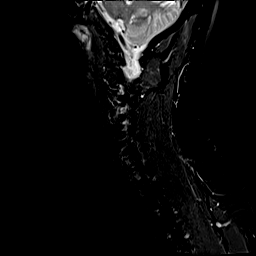
[im 15/15]
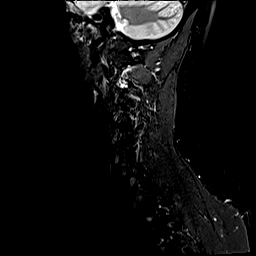

[Series 8: T2 · axial · 3.0mm · 0.66mm/px · z∈[-122,+5]mm · 11 of 38 slices shown (2 of 2)]
[im 1/38]
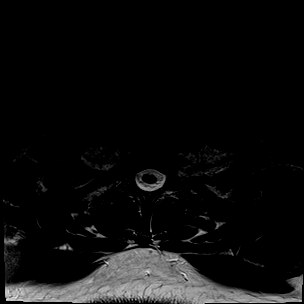
[im 3/38]
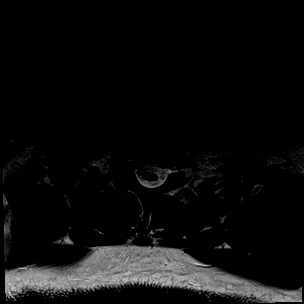
[im 6/38]
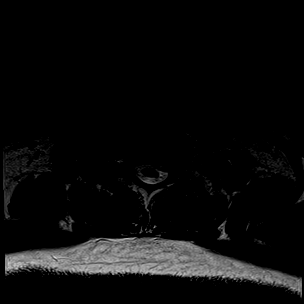
[im 8/38]
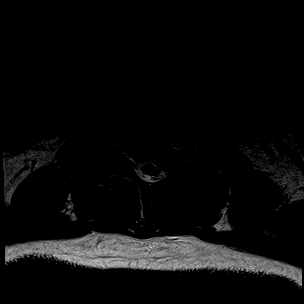
[im 11/38]
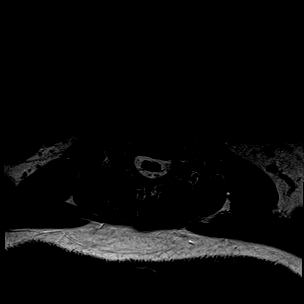
[im 16/38]
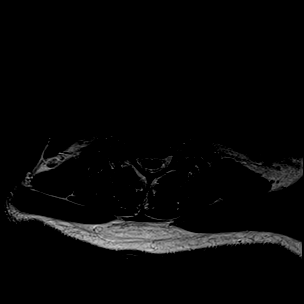
[im 19/38]
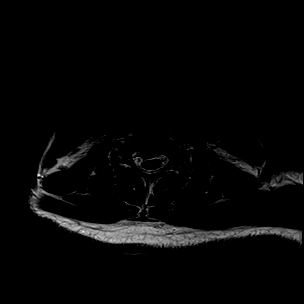
[im 22/38]
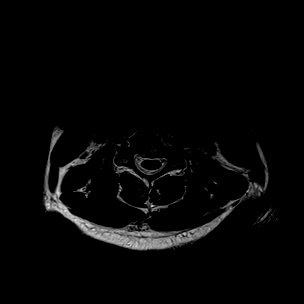
[im 27/38]
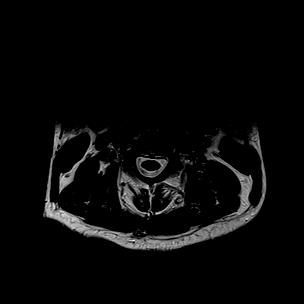
[im 32/38]
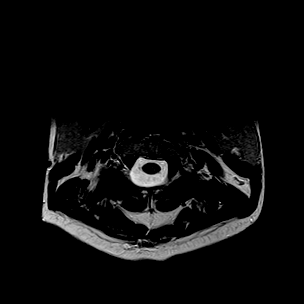
[im 38/38]
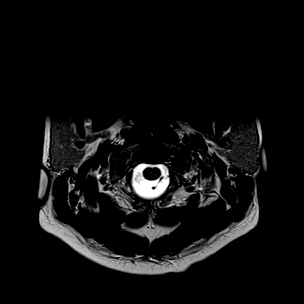

[Series 9: GRE · axial · 3.0mm · 0.39mm/px · z∈[-115,+5]mm · 8 of 40 slices shown]
[im 3/40]
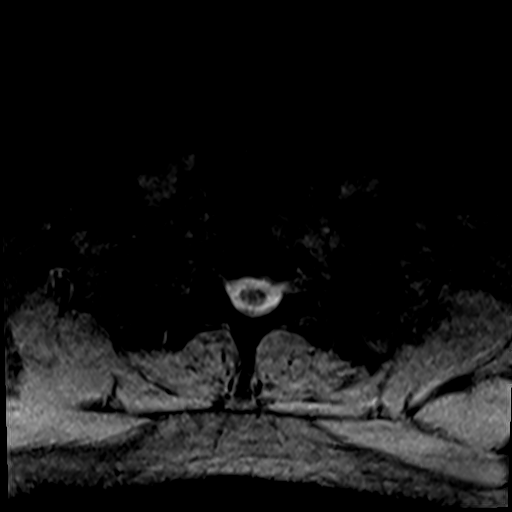
[im 8/40]
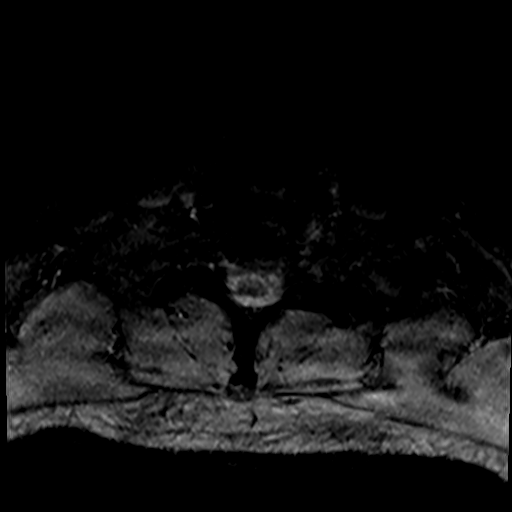
[im 14/40]
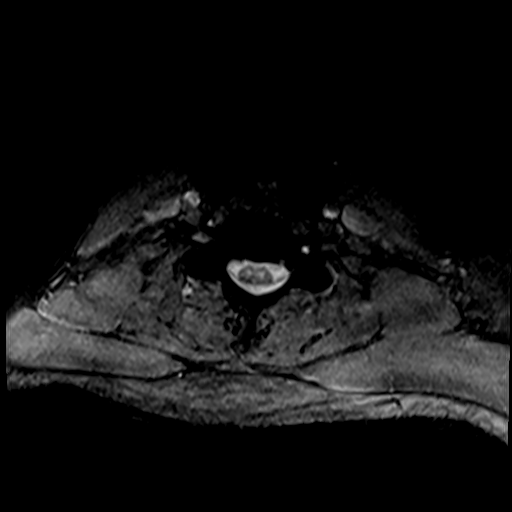
[im 19/40]
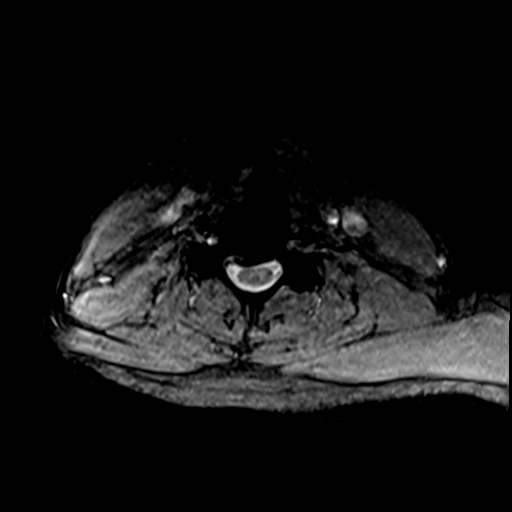
[im 24/40]
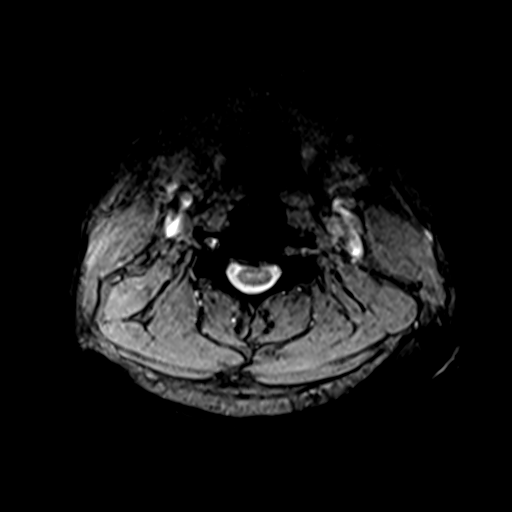
[im 29/40]
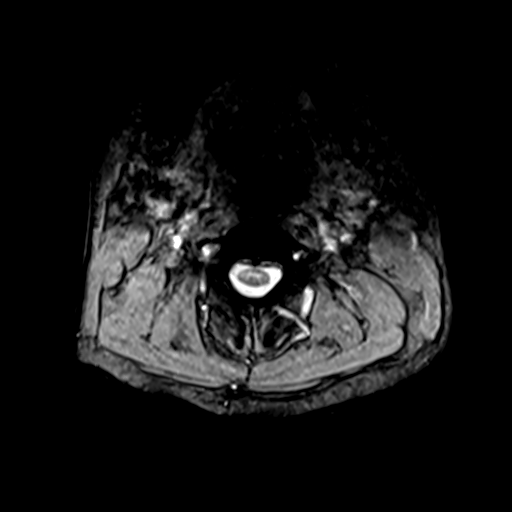
[im 34/40]
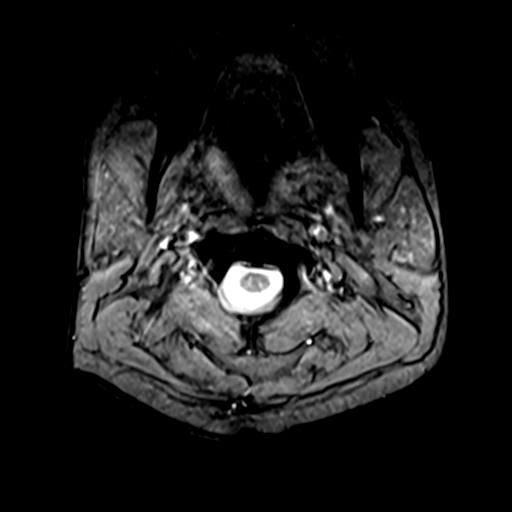
[im 40/40]
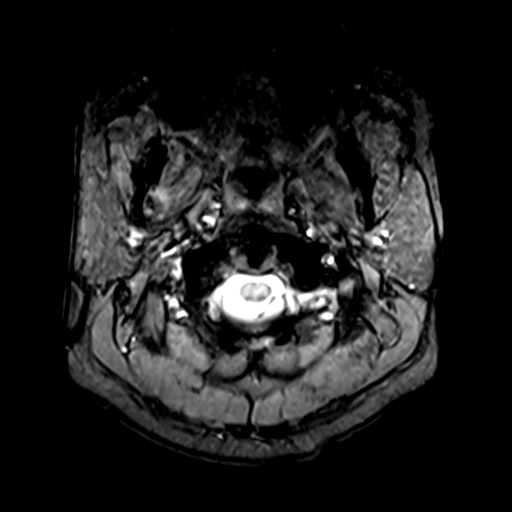

[36 of 48 positions shown; findings below may reference images not displayed]

FINDINGS: Alignment: Physiologic.

Vertebrae: No acute fracture or suspicious osseous lesion. Endplate
degenerative changes, most prominent at C3-C4.

Cord: Normal signal and morphology.

Posterior Fossa, vertebral arteries, paraspinal tissues: Negative.

Disc levels:

C2-C3: No significant disc bulge. Facet and uncovertebral
hypertrophy. No spinal canal stenosis. Mild right-greater-than-left
neural foraminal narrowing.

C3-C4: Mild disc bulge. Uncovertebral and facet arthropathy. No
spinal canal stenosis. Severe left neural foraminal narrowing.

C4-C5: Mild disc bulge with superimposed right paracentral
protrusion, which abuts the ventral spinal cord. Mild spinal canal
stenosis. Facet and uncovertebral hypertrophy. Moderate right and
severe left neural foraminal narrowing.

C5-C6: Mild disc bulge. Uncovertebral and facet arthropathy. No
spinal canal stenosis. Severe right and moderate left neural
foraminal narrowing.

C6-C7: No significant disc bulge. No spinal canal stenosis. Mild
facet and uncovertebral hypertrophy. Mild right neural foraminal
narrowing.

C7-T1: Mild disc bulge with superimposed left subarticular
extrusion, which measures up to 6 x 12 x 11 mm (AP x TR x CC)
(series 8, image 34 and series 5, image 10). This abuts and mildly
deforms the ventral spinal cord. No actual spinal canal stenosis.
Severe left neural foraminal narrowing, with likely compression of
the left C8 nerve. Mild right neural foraminal narrowing.
IMPRESSION: 1. C7-T1 large left subarticular disc extrusion, which abuts and
mildly deforms the ventral spinal cord and causes severe left neural
foraminal narrowing, with likely compression of the exiting left C8
nerve. There is also mild right neural foraminal narrowing at this
level.
2. C4-C5 mild spinal canal stenosis with severe left and moderate
right neural foraminal narrowing.
3. No other spinal canal stenosis. Severe neural foraminal narrowing
is noted on the left at C3-C4 and on the right at C5-C6, with other
less severe neural foraminal narrowing, as described above.

## 2023-12-22 ENCOUNTER — Other Ambulatory Visit (INDEPENDENT_AMBULATORY_CARE_PROVIDER_SITE_OTHER): Payer: Self-pay | Admitting: Primary Care

## 2023-12-22 DIAGNOSIS — M1000 Idiopathic gout, unspecified site: Secondary | ICD-10-CM

## 2023-12-22 NOTE — Telephone Encounter (Signed)
 Copied from CRM [id number]. Topic: Clinical - Medication Refill >> Dec 22, 2023  4:17 PM [name] wrote: Medication: predniSONE  (DELTASONE ) 10 MG tablet   Has the patient contacted their pharmacy? Yes, pharmacy advised they would request but has not heard anything back.   This is the patient's preferred pharmacy:   [id number] - [place], [place] - [address] AT [facility] OF [address] [place] [address] Phone: [phone] Fax: [phone]   Is this the correct pharmacy for this prescription? Yes If no, delete pharmacy and type the correct one.   Has the prescription been filled recently? Yes  Is the patient out of the medication? Yes  Has the patient been seen for an appointment in the last year OR does the patient have an upcoming appointment? Yes, seen July 31  Can we respond through MyChart? Yes  Agent: Please be advised that Rx refills may take up to 3 business days. We ask that you follow-up with your pharmacy.

## 2023-12-24 NOTE — Telephone Encounter (Signed)
 Copied from CRM [id number]. Topic: Clinical - Medication Question >> Dec 23, 2023  5:56 PM Dedra B wrote: Reason for CRM: Pt calling to follow up refill request for prednisone . Pt says he needs it to make sure he can walk for his mother's funeral.

## 2023-12-24 NOTE — Telephone Encounter (Signed)
 Requested medication (s) are due for refill today: routing for review  Requested medication (s) are on the active medication list: yes  Last refill:  09/25/23  Future visit scheduled: no  Notes to clinic:  Unable to refill per protocol, cannot delegate.      Requested Prescriptions  Pending Prescriptions Disp Refills   predniSONE  (DELTASONE ) 10 MG tablet 60 tablet 0    Sig: Take 1-4 tablets (10-40 mg total) by mouth daily. You may take 10-40mg  of prednisone  (1-4 tablets) daily. Recommend 40mg  for 4 days (4 tablets), 30mg  for 3 days, 20mg  for 2 days.     Not Delegated - Endocrinology:  Oral Corticosteroids Failed - 12/24/2023 11:16 AM      Failed - This refill cannot be delegated      Failed - Manual Review: Eye exam for IOP if prolonged treatment      Failed - Glucose (serum) in normal range and within 180 days    Glucose, Bld  Date Value Ref Range Status  08/14/2023 157 (H) 70 - 99 mg/dL Final    Comment:    Glucose reference range applies only to samples taken after fasting for at least 8 hours.   Glucose-Capillary  Date Value Ref Range Status  10/08/2018 187 (H) 70 - 99 mg/dL Final         Failed - Last BP in normal range    BP Readings from Last 1 Encounters:  08/14/23 (!) 162/118         Failed - Valid encounter within last 6 months    Recent Outpatient Visits           3 months ago Idiopathic gout, unspecified chronicity, unspecified site   [place] [facility] Celestia Rosaline SQUIBB, NP   8 months ago Chronic pain syndrome   [place] [facility] Celestia Rosaline SQUIBB, NP   2 years ago Type 2 diabetes mellitus without complication, without long-term current use of insulin  (HCC)   [place] [facility] Celestia Rosaline SQUIBB, NP   4 years ago Essential hypertension   [place] [facility] Celestia Rosaline SQUIBB, NP   4 years ago Essential hypertension   [place] [facility]  Celestia Rosaline SQUIBB, NP              Failed - Bone Mineral Density or Dexa Scan completed in the last 2 years      Passed - K in normal range and within 180 days    Potassium  Date Value Ref Range Status  08/14/2023 4.1 3.5 - 5.1 mmol/L Final         Passed - Na in normal range and within 180 days    Sodium  Date Value Ref Range Status  08/14/2023 135 135 - 145 mmol/L Final  05/25/2021 137 134 - 144 mmol/L Final

## 2023-12-25 ENCOUNTER — Ambulatory Visit: Payer: Self-pay

## 2023-12-25 NOTE — Telephone Encounter (Signed)
 FYI Only or Action Required?: Action required by provider: request for appointment and medication refill request. Pt requesting refill for prednisone  for joint pain. Unable to schedule OV at [facility] within next 3 days.   Patient was last seen in primary care on 09/25/2023 by Celestia Rosaline SQUIBB, NP.  Called Nurse Triage reporting Joint Swelling.  Symptoms began a week ago.  Interventions attempted: OTC medications: Tylenol , ibuprofen , Prescription medications: Hydrocodone , allopurinol , and Other: Crutches.  Symptoms are: gradually worsening.  Triage Disposition: See PCP When Office is Open (Within 3 Days)  Patient/caregiver understands and will follow disposition?: Yes  Copied from CRM #[id number]. Topic: Clinical - Red Word Triage >> Dec 25, 2023  5:35 PM Ricky Glass wrote: Red Word that prompted transfer to Nurse Triage: Patient is calling to report both legs swollen & knee swollen. And on crutches and unable to walk. Patient reporting mother passed funeral is on Saturday. Goat flare up predniSONE  (DELTASONE ) 10 MG tablet [[id number]] the the allopurinol  (ZYLOPRIM ) 100 MG tablet [[id number]] does not work Reason for Disposition  MILD or MODERATE swelling (e.g., can't move joint normally, can't do usual activities) (Exceptions: Itchy, localized swelling; swelling is chronic.)  Answer Assessment - Initial Assessment Questions Pt reports history of gout flares all his life. Reports allopurinol  not effective, requesting rx for prednisone  be called in which he has taken historically for gout flares. No appt availabilty in office within next 3 days. Sending message to clinic to see if pt can be worked in and with pts request for prednisone . Advised UC for worsening symptoms until pt hears back from office.  1. LOCATION: Where is the swelling located?  (e.g., left, right, both knees)     Right knee, right toe, left foot  2. ONSET: When did the swelling start? Does it come and go, or is it  there all the time?     Onset Thursday of last week  3. SWELLING: How bad is the swelling? Or, How large is it? (e.g., mild, moderate, severe; size of localized swelling)      Mild at rest, moderate when up moving. No redness. Able to move joints.  4. PAIN: Is there any pain? If Yes, ask: How bad is it? (Scale 0-10; or none, mild, moderate, severe)     8/10, using crutches  5. SETTING: Has there been any recent work, exercise or other activity that involved that part of the body?      No  6. AGGRAVATING FACTORS: What makes the knee swelling worse? (e.g., walking, climbing stairs, running)     Walking around  7. ASSOCIATED SYMPTOMS: Is there any pain or redness?     Pain, no redness  8. OTHER SYMPTOMS: Do you have any other symptoms? (e.g., calf pain, chest pain, difficulty breathing, fever)     Denies  Protocols used: Knee Swelling-A-AH

## 2023-12-26 NOTE — Telephone Encounter (Signed)
 Pt will need an acute visit

## 2023-12-26 NOTE — Telephone Encounter (Signed)
 Call patient and advised per pcp office that he go to UC or ED for S/S

## 2023-12-31 ENCOUNTER — Ambulatory Visit: Payer: Self-pay | Admitting: Physician Assistant

## 2023-12-31 ENCOUNTER — Encounter: Payer: Self-pay | Admitting: Physician Assistant

## 2023-12-31 VITALS — BP 137/100 | HR 87 | Ht 69.0 in | Wt 174.0 lb

## 2023-12-31 DIAGNOSIS — I10 Essential (primary) hypertension: Secondary | ICD-10-CM

## 2023-12-31 DIAGNOSIS — M10022 Idiopathic gout, left elbow: Secondary | ICD-10-CM

## 2023-12-31 DIAGNOSIS — M10021 Idiopathic gout, right elbow: Secondary | ICD-10-CM

## 2023-12-31 MED ORDER — COLCHICINE 0.6 MG PO TABS: ORAL_TABLET | ORAL | 0 refills | Status: AC

## 2023-12-31 MED ORDER — KETOROLAC TROMETHAMINE 60 MG/2ML IM SOLN
60.0000 mg | Freq: Once | INTRAMUSCULAR | Status: AC
  Administered 2023-12-31: 60 mg via INTRAMUSCULAR

## 2023-12-31 MED ORDER — METHYLPREDNISOLONE ACETATE 80 MG/ML IJ SUSP
80.0000 mg | Freq: Once | INTRAMUSCULAR | Status: AC
  Administered 2023-12-31: 80 mg via INTRAMUSCULAR

## 2023-12-31 NOTE — Patient Instructions (Addendum)
 VISIT SUMMARY:  Today, you were seen for a gout flare-up causing severe pain in your knees and elbows. We discussed your symptoms, current medications, and the importance of consistent treatment to manage your condition effectively.  INSTRUCTIONS:   Gout is painful swelling of your joints. Gout is a type of arthritis. It is caused by having too much uric acid in your body. Uric acid is a chemical that is made when your body breaks down substances called purines. If your body has too much uric acid, sharp crystals can form and build up in your joints. This causes pain and swelling. Gout attacks can happen quickly and be very painful (acute gout). Over time, the attacks can affect more joints and happen more often (chronic gout). What are the causes? Gout is caused by too much uric acid in your blood. This can happen because: Your kidneys do not remove enough uric acid from your blood. Your body makes too much uric acid. You eat too many foods that are high in purines. These foods include organ meats, some seafood, and beer. Trauma or stress can bring on an attack. What increases the risk? Having a family history of gout. Being male and middle-aged. Being male and having gone through menopause. Having an organ transplant. Taking certain medicines. Having certain conditions, such as: Being very overweight (obese). Lead poisoning. Kidney disease. A skin condition called psoriasis. Other risks include: Losing weight too quickly. Not having enough water in the body (being dehydrated). Drinking alcohol, especially beer. Drinking beverages that are sweetened with a type of sugar called fructose. What are the signs or symptoms? An attack of acute gout often starts at night and usually happens in just one joint. The most common place is the big toe. Other joints that may be affected include joints of the feet, ankle, knee, fingers, wrist, or elbow. Symptoms may include: Very bad  pain. Warmth. Swelling. Stiffness. Tenderness. The affected joint may be very painful to touch. Shiny, red, or purple skin. Chills and fever. Chronic gout may cause symptoms more often. More joints may be involved. You may also have white or yellow lumps (tophi) on your hands or feet or in other areas near your joints. How is this treated? Treatment for an acute attack may include medicines for pain and swelling, such as: NSAIDs, such as ibuprofen . Steroids taken by mouth or injected into a joint. Colchicine . This can be given by mouth or through an IV tube. Treatment to prevent future attacks may include: Taking small doses of NSAIDs or colchicine  daily. Using a medicine that reduces uric acid levels in your blood, such as allopurinol . Making changes to your diet. You may need to see a food expert (dietitian) about what to eat and drink to prevent gout. Follow these instructions at home: During a gout attack  If told, put ice on the painful area. To do this: Put ice in a plastic bag. Place a towel between your skin and the bag. Leave the ice on for 20 minutes, 2-3 times a day. Take off the ice if your skin turns bright red. This is very important. If you cannot feel pain, heat, or cold, you have a greater risk of damage to the area. Raise the painful joint above the level of your heart as often as you can. Rest the joint as much as possible. If the joint is in your leg, you may be given crutches. Follow instructions from your doctor about what you cannot eat or drink. Avoiding  future gout attacks Eat a low-purine diet. Avoid foods and drinks such as: Liver. Kidney. Anchovies. Asparagus. Herring. Mushrooms. Mussels. Beer. Stay at a healthy weight. If you want to lose weight, talk with your doctor. Do not lose weight too fast. Start or continue an exercise plan as told by your doctor. Eating and drinking Avoid drinks sweetened by fructose. Drink enough fluids to keep your pee  (urine) pale yellow. If you drink alcohol: Limit how much you have to: 0-1 drink a day for women who are not pregnant. 0-2 drinks a day for men. Know how much alcohol is in a drink. In the [place] one drink equals one 12 oz bottle of beer (355 mL), one 5 oz glass of wine (148 mL), or one 1 oz glass of hard liquor (44 mL). General instructions Take over-the-counter and prescription medicines only as told by your doctor. Ask your doctor if you should avoid driving or using machines while you are taking your medicine. Return to your normal activities when your doctor says that it is safe. Keep all follow-up visits. Where to find more information [employer]: www.niams.http://www.myers.net/ Contact a doctor if: You have another gout attack. You still have symptoms of a gout attack after 10 days of treatment. You have problems (side effects) because of your medicines. You have chills or a fever. You have burning pain when you pee (urinate). You have pain in your lower back or belly. Get help right away if: You have very bad pain. Your pain cannot be controlled. You cannot pee. Summary Gout is painful swelling of the joints. The most common site of pain is the big toe, but it can affect other joints. Medicines and avoiding some foods can help to prevent and treat gout attacks. This information is not intended to replace advice given to you by your health care provider. Make sure you discuss any questions you have with your health care provider. Document Revised: 11/15/2020 Document Reviewed: 11/15/2020 Elsevier Patient Education  2024 [employer].

## 2023-12-31 NOTE — Progress Notes (Unsigned)
   Established Patient Office Visit  Subjective   Patient ID: Ricky Glass, male    DOB: [birth year]  Age: 58 y.o. MRN: [id number]  Chief Complaint  Patient presents with   Gout    He is having a gout flare and usually takes prednisone  to help with the flare up   HPI Ricky Glass is a 58 year old male with gout who presents with a gout flare-up.  He experiences severe pain rated 8 out of 10, primarily in his knees and elbows, which began two weeks ago. The pain is persistent, and he sometimes requires crutches for mobility.  Prednisone  temporarily alleviates the pain, allowing him to walk, but symptoms return the next day. Ibuprofen  800 mg reduces swelling. He takes allopurinol  as needed but feels it worsens symptoms during flare-ups. He has not been taking allopurinol  daily as prescribed. Colchicine  was used during this flare-up, but he has no remaining pills. Previous flare-ups occurred in January, February, and July. Stress from his mother's recent passing may contribute to his condition.  {History (Optional):23778}  Review of Systems  Constitutional: Negative.   HENT: Negative.    Eyes: Negative.   Respiratory: Negative.    Cardiovascular: Negative.   Gastrointestinal: Negative.   Genitourinary: Negative.   Musculoskeletal:  Positive for joint pain.  Skin: Negative.   Neurological: Negative.   Endo/Heme/Allergies: Negative.   Psychiatric/Behavioral: Negative.        Objective:     BP (!) 137/100 (BP Location: Left Arm, Patient Position: Sitting, Cuff Size: Normal)   Pulse 87   Ht 5' 9 (1.753 m)   Wt 174 lb (78.9 kg)   SpO2 98%   BMI 25.70 kg/m  {Vitals History (Optional):23777}  Physical Exam   No results found for any visits on 12/31/23.  {Labs (Optional):23779}  The 10-year ASCVD risk score (Arnett DK, et al., 2019) is: 23.9%    Assessment & Plan:   Problem List Items Addressed This Visit   None   No follow-ups on file.    Kirk RAMAN Mayers,  PA-C

## 2024-01-01 ENCOUNTER — Ambulatory Visit: Payer: Self-pay | Admitting: Physician Assistant

## 2024-01-01 ENCOUNTER — Encounter: Payer: Self-pay | Admitting: Physician Assistant

## 2024-01-01 LAB — URIC ACID: Uric Acid: 8.3 mg/dL (ref 3.8–8.4)

## 2024-01-01 NOTE — Progress Notes (Signed)
 LMOM

## 2024-01-04 NOTE — Telephone Encounter (Signed)
 noted

## 2024-01-19 ENCOUNTER — Telehealth: Payer: Self-pay | Admitting: Physician Assistant

## 2024-01-19 NOTE — Telephone Encounter (Signed)
 Contacted pt to schedule follow up visit for MMU. No answer/LVM

## 2024-01-19 NOTE — Progress Notes (Signed)
 LMOM

## 2024-01-21 NOTE — Progress Notes (Signed)
LMOM, letter sent.
